# Patient Record
Sex: Female | Born: 1935 | Race: White | Hispanic: No | Smoking: Never smoker
Health system: Southern US, Community
[De-identification: ages and names within clinical notes are randomized; demographics above are authoritative.]

## PROBLEM LIST (undated history)

## (undated) DIAGNOSIS — F0390 Unspecified dementia without behavioral disturbance: Secondary | ICD-10-CM

## (undated) DIAGNOSIS — F0280 Dementia in other diseases classified elsewhere without behavioral disturbance: Secondary | ICD-10-CM

## (undated) DIAGNOSIS — G309 Alzheimer's disease, unspecified: Secondary | ICD-10-CM

---

## 2011-08-22 ENCOUNTER — Emergency Department (HOSPITAL_BASED_OUTPATIENT_CLINIC_OR_DEPARTMENT_OTHER)
Admission: EM | Admit: 2011-08-22 | Discharge: 2011-08-22 | Disposition: A | Discharge status: Home / Self Care | Payer: Medicare PPO | Attending: Emergency Medicine | Admitting: Emergency Medicine

## 2011-08-22 ENCOUNTER — Encounter (HOSPITAL_BASED_OUTPATIENT_CLINIC_OR_DEPARTMENT_OTHER): Payer: Self-pay | Admitting: Emergency Medicine

## 2011-08-22 DIAGNOSIS — L739 Follicular disorder, unspecified: Secondary | ICD-10-CM

## 2011-08-22 DIAGNOSIS — F0280 Dementia in other diseases classified elsewhere without behavioral disturbance: Secondary | ICD-10-CM | POA: Insufficient documentation

## 2011-08-22 DIAGNOSIS — L738 Other specified follicular disorders: Secondary | ICD-10-CM | POA: Insufficient documentation

## 2011-08-22 DIAGNOSIS — G309 Alzheimer's disease, unspecified: Secondary | ICD-10-CM | POA: Insufficient documentation

## 2011-08-22 HISTORY — DX: Alzheimer's disease, unspecified: G30.9

## 2011-08-22 HISTORY — DX: Dementia in other diseases classified elsewhere, unspecified severity, without behavioral disturbance, psychotic disturbance, mood disturbance, and anxiety: F02.80

## 2011-08-22 MED ORDER — SULFAMETHOXAZOLE-TRIMETHOPRIM 800-160 MG PO TABS: 1.0000 | ORAL_TABLET | Freq: Two times a day (BID) | ORAL | Status: AC

## 2011-08-22 MED ORDER — PREDNISONE 10 MG PO TABS: 20.0000 mg | ORAL_TABLET | Freq: Every day | ORAL | Status: DC

## 2011-08-22 MED ORDER — SULFAMETHOXAZOLE-TMP DS 800-160 MG PO TABS
1.0000 | ORAL_TABLET | Freq: Once | ORAL | Status: AC
  Administered 2011-08-22: 1 via ORAL
  Filled 2011-08-22: qty 1

## 2011-08-22 MED ORDER — PREDNISONE 20 MG PO TABS
40.0000 mg | ORAL_TABLET | Freq: Once | ORAL | Status: AC
  Administered 2011-08-22: 40 mg via ORAL
  Filled 2011-08-22: qty 2

## 2011-08-22 NOTE — ED Notes (Signed)
ptar notified of need for transport. Facility notified that patient is returning, ed transfer report provided for transfer back to facility along with discharge instructions

## 2011-08-22 NOTE — ED Provider Notes (Signed)
History     CSN: [id number]  Arrival date & time 08/22/11  0015   First MD Initiated Contact with Patient 08/22/11 0125      Chief Complaint  Patient presents with  . Rash    (Consider location/radiation/quality/duration/timing/severity/associated sxs/prior treatment) HPI Comments: Patient presents from an nursing home facility with complaints of a rash. Apparently, the rash started about 3 days ago on her abdomen and then spread to her face and some on her arms. She describes it as very Ht. There is no known new contacts, or new medications. She denies any history of allergic reactions in the past. Denies any fevers. Denies any nausea, vomiting. Denies any swelling of her face or lips. Denies any shortness of breath. She states that the rash has gradually been getting worse over the last few days  Patient is a 78 y.o. female presenting with rash. The history is provided by the patient and the nursing home.  Rash  This is a new problem. The current episode started more than 2 days ago. The problem has been gradually worsening. The problem is associated with nothing. There has been no fever.    Past Medical History  Diagnosis Date  . Alzheimer disease     History reviewed. No pertinent past surgical history.  History reviewed. No pertinent family history.  History  Substance Use Topics  . Smoking status: Not on file  . Smokeless tobacco: Not on file  . Alcohol Use: No    OB History    Grav Para Term Preterm Abortions TAB SAB Ect Mult Living                  Review of Systems  Constitutional: Negative for fever, chills, diaphoresis and fatigue.  HENT: Negative for congestion, rhinorrhea and sneezing.   Eyes: Negative.   Respiratory: Negative for cough, chest tightness and shortness of breath.   Cardiovascular: Negative for chest pain and leg swelling.  Gastrointestinal: Negative for nausea, vomiting, abdominal pain, diarrhea and blood in stool.  Genitourinary:  Negative for frequency, hematuria, flank pain and difficulty urinating.  Musculoskeletal: Negative for back pain and arthralgias.  Skin: Positive for rash.  Neurological: Negative for dizziness, speech difficulty, weakness, numbness and headaches.    Allergies  Review of patient's allergies indicates no known allergies.  Home Medications   Current Outpatient Rx  Name Route Sig Dispense Refill  . PREDNISONE 10 MG PO TABS Oral Take 2 tablets (20 mg total) by mouth daily. 10 tablet 0  . SULFAMETHOXAZOLE-TRIMETHOPRIM 800-160 MG PO TABS Oral Take 1 tablet by mouth every 12 (twelve) hours. 20 tablet 0    BP 156/51  Pulse 76  Temp(Src) 98.1 F (36.7 C) (Oral)  Resp 18  SpO2 99%  Physical Exam  Constitutional: She is oriented to person, place, and time. She appears well-developed and well-nourished.  HENT:  Head: Normocephalic and atraumatic.  Eyes: Pupils are equal, round, and reactive to light.  Neck: Normal range of motion. Neck supple.  Cardiovascular: Normal rate, regular rhythm and normal heart sounds.   Pulmonary/Chest: Effort normal and breath sounds normal. No respiratory distress. She has no wheezes. She has no rales. She exhibits no tenderness.  Abdominal: Soft. Bowel sounds are normal. There is no tenderness. There is no rebound and no guarding.  Musculoskeletal: Normal range of motion. She exhibits no edema.  Lymphadenopathy:    She has no cervical adenopathy.  Neurological: She is alert and oriented to person, place, and time.  Skin:  Skin is warm and dry. Rash noted.       Patient has multiple papules on the face and the chest and abdomen. They are slightly erythematous and are excoriated. There is no vesicular lesions. No signs of cellulitis. No drainage. No petechiae or purpura is noted. There is no rash on the hands or the feet.  Psychiatric: She has a normal mood and affect.    ED Course  Procedures (including critical care time)  Labs Reviewed - No data to  display No results found.   1. Folliculitis       MDM  This rash could represent contact dermatitis versus a folliculitis. Does not appear to be consistent with scabies. Will try course of antibiotics and steroids. Advised to follow up with her primary care physician to assess for improvement        Rolan Bucco, MD 08/22/11 [phone]

## 2011-08-22 NOTE — Discharge Instructions (Signed)
Folliculitis  °Folliculitis is an infection and inflammation of the hair follicles. Hair follicles become red and irritated. This inflammation is usually caused by bacteria. The bacteria thrive in warm, moist environments. This condition can be seen anywhere on the body.  °CAUSES °The most common cause of folliculitis is an infection by germs (bacteria). Fungal and viral infections can also cause the condition. Viral infections may be more common in people whose bodies are unable to fight disease well (weakened immune systems). Examples include people with: °· AIDS.  °· An organ transplant.  °· Cancer.  °People with depressed immune systems, diabetes, or obesity, have a greater risk of getting folliculitis than the general population. Certain chemicals, especially oils and tars, also can cause folliculitis. °SYMPTOMS °· An early sign of folliculitis is a small, white or yellow pus-filled, itchy lesion (pustule). These lesions appear on a red, inflamed follicle. They are usually less than 5 mm (.20 inches).  °· The most likely starting points are the scalp, thighs, legs, back and buttocks. Folliculitis is also frequently found in areas of repeated shaving.  °· When an infection of the follicle goes deeper, it becomes a boil or furuncle. A group of closely packed boils create a larger lesion (a carbuncle). These sores (lesions) tend to occur in hairy, sweaty areas of the body.  °TREATMENT  °· A doctor who specializes in skin problems (dermatologists) treats mild cases of folliculitis with antiseptic washes.  °· They also use a skin application which kills germs (topical antibiotics). Tea tree oil is a good topical antiseptic as well. It can be found at a health food store. A small percentage of individuals may develop an allergy to the tea tree oil.  °· Mild to moderate boils respond well to warm water compresses applied three times daily.  °· In some cases, oral antibiotics should be taken with the skin treatment.    °· If lesions contain large quantities of pus or fluid, your caregiver may drain them. This allows the topical antibiotics to get to the affected areas better.  °· Stubborn cases of folliculitis may respond to laser hair removal. This process uses a high intensity light beam (a laser) to destroy the follicle and reduces the scarring from folliculitis. After laser hair removal, hair will no longer grow in the laser treated area.  °Patients with long-lasting folliculitis need to find out where the infection is coming from. Germs can live in the nostrils of the patient. This can trigger an outbreak now and then. Sometimes the bacteria live in the nostrils of a family member. This person does not develop the disorder but they repeatedly re-expose others to the germ. To break the cycle of recurrence in the patient, the family member must also undergo treatment. °PREVENTION  °· Individuals who are predisposed to folliculitis should be extremely careful about personal hygiene.  °· Application of antiseptic washes may help prevent recurrences.  °· A topical antibiotic cream, mupirocin (Bactroban®), has been effective at reducing bacteria in the nostrils. It is applied inside the nose with your little finger. This is done twice daily for a week. Then it is repeated every 6 months.  °· Because follicle disorders tend to come back, patients must receive follow-up care. Your caregiver may be able to recognize a recurrence before it becomes severe.  °SEEK IMMEDIATE MEDICAL CARE IF:  °· You develop redness, swelling, or increasing pain in the area.  °· You have a fever.  °· You are not improving with treatment   or are getting worse.  °· You have any other questions or concerns.  °Document Released: 05/25/2001 Document Revised: 03/05/2011 Document Reviewed: 03/21/2008 °[employer]® Patient Information ©2012 [employer]. °

## 2011-08-22 NOTE — ED Notes (Signed)
Developed rash several days ago pt is resident from [facility], rash abdomen, chest some on back and upper extremeties

## 2011-09-07 ENCOUNTER — Emergency Department (HOSPITAL_BASED_OUTPATIENT_CLINIC_OR_DEPARTMENT_OTHER): Payer: Medicare PPO

## 2011-09-07 ENCOUNTER — Emergency Department (HOSPITAL_BASED_OUTPATIENT_CLINIC_OR_DEPARTMENT_OTHER)
Admission: EM | Admit: 2011-09-07 | Discharge: 2011-09-07 | Disposition: A | Discharge status: Home / Self Care | Payer: Medicare PPO | Attending: Emergency Medicine | Admitting: Emergency Medicine

## 2011-09-07 ENCOUNTER — Encounter (HOSPITAL_BASED_OUTPATIENT_CLINIC_OR_DEPARTMENT_OTHER): Payer: Self-pay | Admitting: Emergency Medicine

## 2011-09-07 DIAGNOSIS — F0280 Dementia in other diseases classified elsewhere without behavioral disturbance: Secondary | ICD-10-CM | POA: Insufficient documentation

## 2011-09-07 DIAGNOSIS — Z79899 Other long term (current) drug therapy: Secondary | ICD-10-CM | POA: Insufficient documentation

## 2011-09-07 DIAGNOSIS — S62609A Fracture of unspecified phalanx of unspecified finger, initial encounter for closed fracture: Secondary | ICD-10-CM

## 2011-09-07 DIAGNOSIS — Y9210 Unspecified residential institution as the place of occurrence of the external cause: Secondary | ICD-10-CM | POA: Insufficient documentation

## 2011-09-07 DIAGNOSIS — S8263XA Displaced fracture of lateral malleolus of unspecified fibula, initial encounter for closed fracture: Secondary | ICD-10-CM | POA: Insufficient documentation

## 2011-09-07 DIAGNOSIS — G309 Alzheimer's disease, unspecified: Secondary | ICD-10-CM | POA: Insufficient documentation

## 2011-09-07 DIAGNOSIS — M542 Cervicalgia: Secondary | ICD-10-CM | POA: Insufficient documentation

## 2011-09-07 DIAGNOSIS — S82839A Other fracture of upper and lower end of unspecified fibula, initial encounter for closed fracture: Secondary | ICD-10-CM

## 2011-09-07 MED ORDER — ACETAMINOPHEN-CODEINE #3 300-30 MG PO TABS: 1.0000 | ORAL_TABLET | Freq: Four times a day (QID) | ORAL | Status: AC | PRN

## 2011-09-07 MED ORDER — ACETAMINOPHEN 325 MG PO TABS
650.0000 mg | ORAL_TABLET | Freq: Once | ORAL | Status: AC
  Administered 2011-09-07: 650 mg via ORAL
  Filled 2011-09-07: qty 2
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
## 2011-09-07 NOTE — Discharge Instructions (Signed)
Ankle Fracture A fracture is a break in the bone. A cast or splint is used to protect and keep your injured bone from moving.  HOME CARE INSTRUCTIONS   Use your crutches as directed.   To lessen the swelling, keep the injured leg elevated while sitting or lying down.   Apply ice to the injury for 15 to 20 minutes, 3 to 4 times per day while awake for 2 days. Put the ice in a plastic bag and place a thin towel between the bag of ice and your cast.   If you have a plaster or fiberglass cast:   Do not try to scratch the skin under the cast using sharp or pointed objects.   Check the skin around the cast every day. You may put lotion on any red or sore areas.   Keep your cast dry and clean.   If you have a plaster splint:   Wear the splint as directed.   You may loosen the elastic around the splint if your toes become numb, tingle, or turn cold or blue.   Do not put pressure on any part of your cast or splint; it may break. Rest your cast only on a pillow the first 24 hours until it is fully hardened.   Your cast or splint can be protected during bathing with a plastic bag. Do not lower the cast or splint into water.   Take medications as directed by your caregiver. Only take over-the-counter or prescription medicines for pain, discomfort, or fever as directed by your caregiver.   Do not drive a vehicle until your caregiver specifically tells you it is safe to do so.   If your caregiver has given you a follow-up appointment, it is very important to keep that appointment. Not keeping the appointment could result in a chronic or permanent injury, pain, and disability. If there is any problem keeping the appointment, you must call back to this facility for assistance.  SEEK IMMEDIATE MEDICAL CARE IF:   Your cast gets damaged or breaks.   You have continued severe pain or more swelling than you did before the cast was put on.   Your skin or toenails below the injury turn blue or gray,  or feel cold or numb.   There is a bad smell or new stains and/or purulent (pus like) drainage coming from under the cast.  If you do not have a window in your cast for observing the wound, a discharge or minor bleeding may show up as a stain on the outside of your cast. Report these findings to your caregiver. MAKE SURE YOU:   Understand these instructions.   Will watch your condition.   Will get help right away if you are not doing well or get worse.  Document Released: 03/13/2000 Document Revised: 03/05/2011 Document Reviewed: 10/18/2007 [facility] Patient Information 2012 [place], [place].   Finger Fracture Fractures of fingers are breaks in the bones of the fingers. There are many types of fractures. There are different ways of treating these fractures, all of which can be correct. Your caregiver will discuss the best way to treat your fracture. TREATMENT  Finger fractures can be treated with:   Non-reduction - this means the bones are in place. The finger is splinted without changing the positions of the bone pieces. The splint is usually left on for about a week to ten days. This will depend on your fracture and what your caregiver thinks.   Closed reduction -  the bones are put back into position without using surgery. The finger is then splinted.   ORIF (open reduction and internal fixation) - the fracture site is opened. Then the bone pieces are fixed into place with pins or some type of hardware. This is seldom required. It depends on the severity of the fracture.  Your caregiver will discuss the type of fracture you have and the treatment that will be best for that problem. If surgery is the treatment of choice, the following is information for you to know and also let your caregiver know about prior to surgery. LET YOUR CAREGIVER KNOW ABOUT:  Allergies   Medications taken including herbs, eye drops, over the counter medications, and creams   Use of steroids (by mouth or  creams)   Previous problems with anesthetics or Novocaine   Possibility of pregnancy, if this applies   History of blood clots (thrombophlebitis)   History of bleeding or blood problems   Previous surgery   Other health problems  AFTER THE PROCEDURE After surgery, you will be taken to the recovery area where a nurse will check your progress. Once you're awake, stable, and taking fluids well, barring other problems you will be allowed to go home. Once home an ice pack applied to your operative site may help with discomfort and keep the swelling down. HOME CARE INSTRUCTIONS   Follow your caregiver's instructions as to activities, exercises, physical therapy, and driving a car.   Use your finger and exercise as directed.   Only take over-the-counter or prescription medicines for pain, discomfort, or fever as directed by your caregiver. Do not take aspirin until your caregiver OK's it, as this can increase bleeding immediately following surgery.   Stop using ibuprofen if it upsets your stomach. Let your caregiver know about it.  SEEK MEDICAL CARE IF:  You have increased bleeding (more than a small spot) from the wound or from beneath your splint.   You develop redness, swelling, or increasing pain in the wound or from beneath your splint.   There is pus coming from the wound or from beneath your splint.   An unexplained oral temperature above 102 F (38.9 C) develops, or as your caregiver suggests.   There is a foul smell coming from the wound or dressing or from beneath your splint.  SEEK IMMEDIATE MEDICAL CARE IF:   You develop a rash.   You have difficulty breathing.   You have any allergic problems.  MAKE SURE YOU:   Understand these instructions.   Will watch your condition.   Will get help right away if you are not doing well or get worse.  Document Released: 06/28/2000 Document Revised: 03/05/2011 Document Reviewed: 11/03/2007 [facility] Patient Information 2012  [place], [place].    Narcotic and benzodiazepine use may cause drowsiness, slowed breathing or dependence.  Please use with caution and do not drive, operate machinery or watch young children alone while taking them.  Taking combinations of these medications or drinking alcohol will potentiate these effects.
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
## 2012-03-14 ENCOUNTER — Emergency Department (HOSPITAL_COMMUNITY): Payer: Medicare PPO

## 2012-03-14 ENCOUNTER — Emergency Department (HOSPITAL_COMMUNITY)
Admission: EM | Admit: 2012-03-14 | Discharge: 2012-03-15 | Disposition: A | Discharge status: Skilled Nursing Facility | Payer: Medicare PPO | Attending: Emergency Medicine | Admitting: Emergency Medicine

## 2012-03-14 DIAGNOSIS — J029 Acute pharyngitis, unspecified: Secondary | ICD-10-CM | POA: Insufficient documentation

## 2012-03-14 DIAGNOSIS — R05 Cough: Secondary | ICD-10-CM | POA: Insufficient documentation

## 2012-03-14 DIAGNOSIS — F0280 Dementia in other diseases classified elsewhere without behavioral disturbance: Secondary | ICD-10-CM | POA: Insufficient documentation

## 2012-03-14 DIAGNOSIS — G309 Alzheimer's disease, unspecified: Secondary | ICD-10-CM | POA: Insufficient documentation

## 2012-03-14 DIAGNOSIS — R059 Cough, unspecified: Secondary | ICD-10-CM | POA: Insufficient documentation

## 2012-03-14 DIAGNOSIS — Z79899 Other long term (current) drug therapy: Secondary | ICD-10-CM | POA: Insufficient documentation

## 2012-03-14 DIAGNOSIS — Z791 Long term (current) use of non-steroidal anti-inflammatories (NSAID): Secondary | ICD-10-CM | POA: Insufficient documentation

## 2012-03-14 LAB — CBC
HCT: 35.2 % — ABNORMAL LOW (ref 36.0–46.0)
Hemoglobin: 11.4 g/dL — ABNORMAL LOW (ref 12.0–15.0)
MCH: 28.2 pg (ref 26.0–34.0)
MCHC: 32.4 g/dL (ref 30.0–36.0)
RDW: 14.9 % (ref 11.5–15.5)

## 2012-03-14 NOTE — ED Provider Notes (Addendum)
History     CSN: [id number]  Arrival date & time 03/14/12  2259   First MD Initiated Contact with Patient 03/14/12 2318      Chief Complaint  Patient presents with  . Allergic Reaction  . Cough    (Consider location/radiation/quality/duration/timing/severity/associated sxs/prior treatment) HPI History provided by patient. Complaining of throat discomfort since yesterday and worse tonight. She has some dizziness, some dry cough and feels like it is acting her breathing. No associated rash or itching. She denies dyspnea. Resides at an assisted-living facility. No fevers. States some difficulty swallowing, does not feel like food or fluids get stuck. No chest pain. Some generalized malaise. No abdominal pain or vomiting. Symptoms moderate in severity. Patient tells me she has history of same Past Medical History  Diagnosis Date  . Alzheimer disease     No past surgical history on file.  No family history on file.  History  Substance Use Topics  . Smoking status: Not on file  . Smokeless tobacco: Not on file  . Alcohol Use: No    OB History    Grav Para Term Preterm Abortions TAB SAB Ect Mult Living                  Review of Systems  Constitutional: Negative for fever and chills.  HENT: Positive for sore throat. Negative for neck pain, neck stiffness and voice change.   Eyes: Negative for pain.  Respiratory: Positive for cough. Negative for shortness of breath.   Cardiovascular: Negative for chest pain.  Gastrointestinal: Negative for vomiting and abdominal pain.  Genitourinary: Negative for dysuria.  Musculoskeletal: Negative for back pain.  Skin: Negative for rash.  Neurological: Negative for headaches.  All other systems reviewed and are negative.    Allergies  Review of patient's allergies indicates no known allergies.  Home Medications   Current Outpatient Rx  Name  Route  Sig  Dispense  Refill  . ACETAMINOPHEN 500 MG PO TABS   Oral   Take 500 mg by  mouth every 6 (six) hours as needed. For pain or fever greater than 100         . ALENDRONATE SODIUM 35 MG PO TABS   Oral   Take 35 mg by mouth every 7 (seven) days. On Fridays.Take with a full glass of water on an empty stomach.         . CEPHALEXIN 500 MG PO CAPS   Oral   Take 500 mg by mouth every 12 (twelve) hours. For 10 days to complete on 09/11/11         . DESONIDE 0.05 % EX CREA   Topical   Apply 1 application topically 2 (two) times daily. To rash on face for 10 days or until rash healed. Started on 09/02/11         . DONEPEZIL HCL 10 MG PO TABS   Oral   Take 20 mg by mouth at bedtime.         Marland Kitchen GABAPENTIN 600 MG PO TABS   Oral   Take 300 mg by mouth 2 (two) times daily.         Marland Kitchen MELATONIN 1 MG PO TABS   Oral   Take 2 tablets by mouth at bedtime. For insomnia         . SENNOSIDES 8.6 MG PO TABS   Oral   Take 1 tablet by mouth daily.         . SERTRALINE HCL 50  MG PO TABS   Oral   Take 50 mg by mouth every morning.           BP 153/69  Pulse 66  Temp 97.9 F (36.6 C) (Oral)  Resp 18  SpO2 98%  Physical Exam  Constitutional: She appears well-developed and well-nourished.  HENT:  Head: Normocephalic and atraumatic.  Mouth/Throat: Oropharynx is clear and moist. No oropharyngeal exudate.       Uvula midline, no posterior pharynx erythema or exudates  Eyes: Conjunctivae normal and EOM are normal. Pupils are equal, round, and reactive to light.  Neck: Trachea normal and normal range of motion. Neck supple. No tracheal deviation present. No thyromegaly present.  Cardiovascular: Normal rate, regular rhythm, S1 normal, S2 normal and normal pulses.     No systolic murmur is present   No diastolic murmur is present  Pulses:      Radial pulses are 2+ on the right side, and 2+ on the left side.  Pulmonary/Chest: Effort normal and breath sounds normal. No stridor. She has no wheezes. She has no rhonchi. She has no rales. She exhibits no tenderness.   Abdominal: Soft. Normal appearance and bowel sounds are normal. There is no tenderness. There is no CVA tenderness and negative Murphy's sign.  Musculoskeletal:       BLE:s Calves nontender, no cords or erythema, negative Homans sign  Lymphadenopathy:    She has no cervical adenopathy.  Neurological: She has normal strength. No cranial nerve deficit or sensory deficit. GCS eye subscore is 4. GCS verbal subscore is 5. GCS motor subscore is 6.       Awake alert and oriented, speech clear, no focal deficits  Skin: Skin is warm and dry. No rash noted. She is not diaphoretic.  Psychiatric: Her speech is normal.       Cooperative and appropriate    ED Course  Procedures (including critical care time)  Results for orders placed during the hospital encounter of 03/14/12  CBC      Component Value Range   WBC 8.1  4.0 - 10.5 K/uL   RBC 4.04  3.87 - 5.11 MIL/uL   Hemoglobin 11.4 (*) 12.0 - 15.0 g/dL   HCT 84.1 (*) 32.4 - 40.1 %   MCV 87.1  78.0 - 100.0 fL   MCH 28.2  26.0 - 34.0 pg   MCHC 32.4  30.0 - 36.0 g/dL   RDW 02.7  25.3 - 66.4 %   Platelets 185  150 - 400 K/uL  POCT I-STAT, CHEM 8      Component Value Range   Sodium 140  135 - 145 mEq/L   Potassium 3.9  3.5 - 5.1 mEq/L   Chloride 107  96 - 112 mEq/L   BUN 16  6 - 23 mg/dL   Creatinine, Ser 4.03  0.50 - 1.10 mg/dL   Glucose, Bld 93  70 - 99 mg/dL   Calcium, Ion 4.74  2.59 - 1.30 mmol/L   TCO2 25  0 - 100 mmol/L   Hemoglobin 11.9 (*) 12.0 - 15.0 g/dL   HCT 56.3 (*) 87.5 - 64.3 %  POCT I-STAT TROPONIN I      Component Value Range   Troponin i, poc 0.00  0.00 - 0.08 ng/mL   Comment 3           POCT I-STAT TROPONIN I      Component Value Range   Troponin i, poc 0.00  0.00 - 0.08 ng/mL  Comment 3            Dg Neck Soft Tissue  03/15/2012  *RADIOLOGY REPORT*  Clinical Data: Sore throat, cough  NECK SOFT TISSUES - 1+ VIEW  Comparison: None.  Findings: Unremarkable lateral neck soft tissues.  Normal epiglottis.  No  prevertebral soft tissue swelling/retropharyngeal abscess.  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  IMPRESSION: Unremarkable lateral neck soft tissues.   Original Report Authenticated By: Charline Bills, M.D.    Dg Chest 2 View  03/14/2012  *RADIOLOGY REPORT*  Clinical Data: Cough  CHEST - 2 VIEW  Comparison: None.  Findings: Lungs are essentially clear.  No focal consolidation. No pleural effusion or pneumothorax.  Cardiomediastinal silhouette is within normal limits.  Mild degenerative changes of the visualized thoracolumbar spine.  IMPRESSION: No evidence of acute cardiopulmonary disease.   Original Report Authenticated By: Charline Bills, M.D.      Date: 03/14/2012  Rate: 63  Rhythm: normal sinus rhythm  QRS Axis: normal  Intervals: normal  ST/T Wave abnormalities: nonspecific ST changes  Conduction Disutrbances:none  Narrative Interpretation:   Old EKG Reviewed: none available  12:43 AM patient does have Alzheimer's, spoke with nursing facility and this is a new complaint for patient, no new meds, has been able to feed herself and take medications without issues. Attempted to discuss with family - no answer at telephone numbers provided.  Patient given soluMedrol and Benadryl on recheck has persistent complaints. Medicine consult obtained. Dr. Joneen Roach, [facility] hospitalist, evaluated bedside and patient is now asymptomatic. Repeat troponin obtained and negative as above. Dr. Joneen Roach recommends safe for discharge home  MDM   Throat pain with dyspnea. Evaluated with EKG, serial cardiac enzymes, and imaging as above. Given site Medrol Benadryl for possible allergic reaction. Medicine consult. Symptomatically improved. Patient discharged back to nursing facility.   No fevers.  Tolerates PO fluids without distress.     Sunnie Nielsen, MD 03/15/12 1610  Sunnie Nielsen, MD 03/15/12 [phone]

## 2012-03-14 NOTE — ED Notes (Signed)
Brought in from AL facility by EMS. Pt c/o general malaise, sore throat, cough today, dizzy and weakness. "feels like something is in here" as she point to her chest. Tightness and Pressure is on my chest, I cant breathe good.

## 2012-03-14 NOTE — ED Notes (Signed)
Per EMS: thought to have had an allergic rxn, cough x 1 day. Skin warm and dry, no uticaria, no change in ADL's per staff. Abnl in her throat x 1 day. Denies N/V T: 98.8 (T)  BP: 160/63  P:70  RR: 20 98 % RA   CBG-91

## 2012-03-14 NOTE — ED Notes (Signed)
[id number]<WG> Expected date:<BR> Expected time:<BR> Means of arrival:<BR> Comments:<BR> 78 yo female from assisted living-thought she was having an allergic reaction-states &quot;felt different&quot;-per EMS/no rash/hives or itching-no SOB

## 2012-03-15 ENCOUNTER — Encounter (HOSPITAL_COMMUNITY): Payer: Self-pay

## 2012-03-15 LAB — POCT I-STAT, CHEM 8
Creatinine, Ser: 0.9 mg/dL (ref 0.50–1.10)
Glucose, Bld: 93 mg/dL (ref 70–99)
HCT: 35 % — ABNORMAL LOW (ref 36.0–46.0)
Hemoglobin: 11.9 g/dL — ABNORMAL LOW (ref 12.0–15.0)
Potassium: 3.9 mEq/L (ref 3.5–5.1)
TCO2: 25 mmol/L (ref 0–100)

## 2012-03-15 LAB — POCT I-STAT TROPONIN I: Troponin i, poc: 0 ng/mL (ref 0.00–0.08)

## 2012-03-15 MED ORDER — PREDNISONE 20 MG PO TABS: 60.0000 mg | ORAL_TABLET | Freq: Every day | ORAL | Status: DC

## 2012-03-15 MED ORDER — FAMOTIDINE IN NACL 20-0.9 MG/50ML-% IV SOLN
20.0000 mg | Freq: Once | INTRAVENOUS | Status: AC
Start: 2012-03-15 — End: 2012-03-15
  Administered 2012-03-15: 20 mg via INTRAVENOUS
  Filled 2012-03-15: qty 50

## 2012-03-15 MED ORDER — METHYLPREDNISOLONE SODIUM SUCC 125 MG IJ SOLR
125.0000 mg | Freq: Once | INTRAMUSCULAR | Status: AC
  Administered 2012-03-15: 125 mg via INTRAVENOUS
  Filled 2012-03-15: qty 2

## 2012-03-15 MED ORDER — DIPHENHYDRAMINE HCL 50 MG/ML IJ SOLN
25.0000 mg | Freq: Once | INTRAMUSCULAR | Status: AC
  Administered 2012-03-15: 25 mg via INTRAVENOUS
  Filled 2012-03-15: qty 1

## 2012-03-15 NOTE — ED Notes (Signed)
Hospitalist at bedisde.

## 2012-03-15 NOTE — ED Notes (Signed)
Spk  with both Monica, Med tech that gives medications at AL. She states pt has  never c/o difficulty with swallowing. States she didn't mention any problems when taking  Medications this evening. States patient is just forgetful  Marchelle Folks, nurse at [facility] facility states she is independent of eating. No complication with swallowing. This is why she felt she needed to be sent out d/t new onset of sx's

## 2012-03-15 NOTE — ED Notes (Addendum)
Pt was offered water. Stayed in her room while water was administered. Pt states she had not problems with swallowing water. She just states it feels like her throat is closing. No cough or gurgling heard by this nurse.

## 2012-03-15 NOTE — ED Notes (Signed)
Unable to call report to [facility].

## 2012-03-15 NOTE — ED Notes (Signed)
Patient is resting comfortably. 

## 2012-06-09 ENCOUNTER — Emergency Department (HOSPITAL_COMMUNITY)
Admission: EM | Admit: 2012-06-09 | Discharge: 2012-06-09 | Disposition: A | Discharge status: Home / Self Care | Payer: Medicare PPO | Attending: Emergency Medicine | Admitting: Emergency Medicine

## 2012-06-09 ENCOUNTER — Encounter (HOSPITAL_COMMUNITY): Payer: Self-pay | Admitting: *Deleted

## 2012-06-09 DIAGNOSIS — F0280 Dementia in other diseases classified elsewhere without behavioral disturbance: Secondary | ICD-10-CM | POA: Insufficient documentation

## 2012-06-09 DIAGNOSIS — Z79899 Other long term (current) drug therapy: Secondary | ICD-10-CM | POA: Insufficient documentation

## 2012-06-09 DIAGNOSIS — B3731 Acute candidiasis of vulva and vagina: Secondary | ICD-10-CM | POA: Insufficient documentation

## 2012-06-09 DIAGNOSIS — N764 Abscess of vulva: Secondary | ICD-10-CM | POA: Insufficient documentation

## 2012-06-09 DIAGNOSIS — G309 Alzheimer's disease, unspecified: Secondary | ICD-10-CM | POA: Insufficient documentation

## 2012-06-09 MED ORDER — LIDOCAINE HCL (PF) 1 % IJ SOLN
5.0000 mL | Freq: Once | INTRAMUSCULAR | Status: DC
  Filled 2012-06-09: qty 5

## 2012-06-09 MED ORDER — FLUCONAZOLE 100 MG PO TABS
150.0000 mg | ORAL_TABLET | Freq: Once | ORAL | Status: AC
  Administered 2012-06-09: 150 mg via ORAL
  Filled 2012-06-09: qty 2

## 2012-06-09 MED ORDER — CEPHALEXIN 500 MG PO CAPS: 500.0000 mg | ORAL_CAPSULE | Freq: Four times a day (QID) | ORAL | Status: DC

## 2012-06-09 NOTE — ED Notes (Signed)
Pt has rash and swollen areas on bilateral labial region of vagina. States that her vagina has been swollen, red and painful for past month and that she just told the facility about it a couple of days ago.

## 2012-06-09 NOTE — ED Notes (Signed)
Called [facility] to pick patient up to return her to [facility]

## 2012-06-09 NOTE — ED Notes (Signed)
Pt from [facility] and was recently diagnosed and treated for a yeast infection. Facility noticed perineal blisters with pus and called EMS to bring pt to Er for further evaluation. VSS. 132/70, HR 60, RR 18. Alert and oriented x 4. Neuro intact.

## 2012-06-09 NOTE — ED Provider Notes (Signed)
History     CSN: [id number]  Arrival date & time 06/09/12  1525   First MD Initiated Contact with Patient 06/09/12 1529      Chief Complaint  Patient presents with  . Rash    (Consider location/radiation/quality/duration/timing/severity/associated sxs/prior treatment) Patient is a 79 y.o. female presenting with rash and abscess.  Rash Associated symptoms: no abdominal pain, no diarrhea, no fever, no nausea, no shortness of breath and not vomiting   Abscess Location:  Ano-genital Ano-genital abscess location:  Vagina Size:  1x1cm Abscess quality: draining, fluctuance, painful and redness   Red streaking: yes   Progression:  Worsening Pain details:    Quality:  Unable to specify   Severity:  Moderate Context comment:  Felt to have a yeast infection several days ago Relieved by:  Nothing Worsened by:  Draining/squeezing Associated symptoms: no fever, no nausea and no vomiting     Past Medical History  Diagnosis Date  . Alzheimer disease     No past surgical history on file.  No family history on file.  History  Substance Use Topics  . Smoking status: Not on file  . Smokeless tobacco: Not on file  . Alcohol Use: No    OB History   Grav Para Term Preterm Abortions TAB SAB Ect Mult Living                  Review of Systems  Constitutional: Negative for fever.  HENT: Negative for congestion.   Respiratory: Negative for cough and shortness of breath.   Cardiovascular: Negative for chest pain.  Gastrointestinal: Negative for nausea, vomiting, abdominal pain and diarrhea.  Genitourinary: Negative for difficulty urinating.  Skin: Positive for rash.  All other systems reviewed and are negative.    Allergies  Review of patient's allergies indicates no known allergies.  Home Medications   Current Outpatient Rx  Name  Route  Sig  Dispense  Refill  . acetaminophen (TYLENOL) 500 MG tablet   Oral   Take 500 mg by mouth 2 (two) times daily. May take  additional tablet every 6 hours as needed for pain or fever >100         . alendronate (FOSAMAX) 35 MG tablet   Oral   Take 35 mg by mouth every 7 (seven) days. On Fridays.Take with a full glass of water on an empty stomach.         . donepezil (ARICEPT) 10 MG tablet   Oral   Take 20 mg by mouth at bedtime.         . gabapentin (NEURONTIN) 600 MG tablet   Oral   Take 300 mg by mouth 2 (two) times daily.         . Melatonin 1 MG TABS   Oral   Take 2 tablets by mouth at bedtime. For insomnia         . predniSONE (DELTASONE) 20 MG tablet   Oral   Take 3 tablets (60 mg total) by mouth daily.   15 tablet   0   . senna (SENOKOT) 8.6 MG tablet   Oral   Take 1 tablet by mouth every morning.          . sertraline (ZOLOFT) 50 MG tablet   Oral   Take 50 mg by mouth every morning.           There were no vitals taken for this visit.  Physical Exam  Nursing note and vitals reviewed. Constitutional: She is  oriented to person, place, and time. She appears well-developed and well-nourished. No distress.  HENT:  Head: Normocephalic and atraumatic.  Mouth/Throat: Oropharynx is clear and moist.  Eyes: Conjunctivae are normal. Pupils are equal, round, and reactive to light. No scleral icterus.  Neck: Neck supple.  Cardiovascular: Normal rate, regular rhythm, normal heart sounds and intact distal pulses.   No murmur heard. Pulmonary/Chest: Effort normal and breath sounds normal. No stridor. No respiratory distress. She has no rales.  Abdominal: Soft. Bowel sounds are normal. She exhibits no distension. There is no tenderness.  Genitourinary:  1 x 1 cm abscess to right labia majora.  Bilateral labia are beefy red.    Musculoskeletal: Normal range of motion.  Neurological: She is alert and oriented to person, place, and time.  Skin: Skin is warm and dry. No rash noted.  Psychiatric: She has a normal mood and affect. Her behavior is normal.    ED Course  INCISION AND  DRAINAGE Date/Time: 06/09/2012 4:44 PM Performed by: Rennis Petty Authorized by: Rennis Petty Consent: Verbal consent obtained. Risks and benefits: risks, benefits and alternatives were discussed Type: abscess Body area: anogenital Location details: vulva Local anesthetic: lidocaine 1% without epinephrine Scalpel size: 11 Incision type: single straight Complexity: simple Drainage: purulent Drainage amount: moderate Wound treatment: wound left open Packing material: 1/4 in iodoform gauze Patient tolerance: Patient tolerated the procedure well with no immediate complications.   (including critical care time)  Labs Reviewed - No data to display No results found.   1. Abscess of labia majora   2. Candidiasis of vulva       MDM  79 yo female with abscess to right labia majora in setting of candidal yeast infection.  Drained without difficulty.  Given fluconazole and keflex.  Well appearing, non toxic, no systemic symptoms.  Given return precautions and DC'd back to SNF.          Rennis Petty, MD 06/09/12 [phone]

## 2012-06-10 NOTE — ED Provider Notes (Signed)
I saw and evaluated the patient, reviewed the resident's note and I agree with the findings and plan. Pt with groin rash/fungal rash, perineal abscess. abd soft nt. No fever. I and D.   Suzi Roots, MD 06/10/12 [phone]

## 2012-06-11 ENCOUNTER — Encounter (HOSPITAL_COMMUNITY): Payer: Self-pay | Admitting: Nurse Practitioner

## 2012-06-11 ENCOUNTER — Emergency Department (HOSPITAL_COMMUNITY)
Admission: EM | Admit: 2012-06-11 | Discharge: 2012-06-11 | Disposition: A | Discharge status: Home / Self Care | Payer: Medicare PPO | Attending: Emergency Medicine | Admitting: Emergency Medicine

## 2012-06-11 DIAGNOSIS — Z79899 Other long term (current) drug therapy: Secondary | ICD-10-CM | POA: Insufficient documentation

## 2012-06-11 DIAGNOSIS — B3789 Other sites of candidiasis: Secondary | ICD-10-CM | POA: Insufficient documentation

## 2012-06-11 DIAGNOSIS — F0280 Dementia in other diseases classified elsewhere without behavioral disturbance: Secondary | ICD-10-CM | POA: Insufficient documentation

## 2012-06-11 DIAGNOSIS — N751 Abscess of Bartholin's gland: Secondary | ICD-10-CM | POA: Insufficient documentation

## 2012-06-11 DIAGNOSIS — G309 Alzheimer's disease, unspecified: Secondary | ICD-10-CM | POA: Insufficient documentation

## 2012-06-11 NOTE — ED Provider Notes (Signed)
  Medical screening examination/treatment/procedure(s) were conducted as a shared visit with non-physician practitioner(s) and myself.  I personally evaluated the patient during the encounter  I personally evaluated this patient with chaperone present in the exam room, has bilateral labial abscesses, mild tenderness, left labial abscess spontaneously draining through a very small area. Fluctuance on the left, induration on the right. No fever, otherwise the patient has a benign appearance with a soft abdomen, clear heart and lung sounds.  Incision and drainage performed by physician assistant  Vida Roller, MD 06/11/12 2358

## 2012-06-11 NOTE — ED Notes (Signed)
Report given to [facility], pt to f/u for wound check in 2 days.  Not to remove Word catheters for Bartholin's cyst.  Pt voiced understanding not to remove drains.

## 2012-06-11 NOTE — ED Notes (Signed)
Pt called ems because she was told to return here for wound recheck today. Pt with abscess to labia majora that she was treated here for on 3/13 and reports no complaints and wound appears to be healing well

## 2012-06-11 NOTE — ED Provider Notes (Signed)
History     CSN: [id number]  Arrival date & time 06/11/12  1354   First MD Initiated Contact with Patient 06/11/12 1529      Chief Complaint  Patient presents with  . Wound Infection    (Consider location/radiation/quality/duration/timing/severity/associated sxs/prior treatment) The history is provided by the patient and medical records. The history is limited by the absence of a caregiver (pt with Alzheimers). No language interpreter was used.   Renee Kramer is a 79 y.o. female  with a hx of Alzheimers presents to the Emergency Department complaining of gradual, persistent, progressively worsening pain in the labia onset before 06/09/2012. Associated symptoms include pain in her labia majora, drainage, itching.  Nothing makes it better and palpation makes it worse.  Pt denies fever, chills, chest pain, shortness of breath, nausea, vomiting, diarrhea, weakness, dizziness, syncope, dysuria.  Patient is a poor historian.   Patient is unsure what medication she was given and thinks she has been taking it.  Record review: Patient was seen on 313 for abscess to the labia majora with I&D at that time. Patient also found to have candidal infection of the labia and was treated with Keflex and Diflucan.   Past Medical History  Diagnosis Date  . Alzheimer disease     History reviewed. No pertinent past surgical history.  History reviewed. No pertinent family history.  History  Substance Use Topics  . Smoking status: Not on file  . Smokeless tobacco: Not on file  . Alcohol Use: No    OB History   Grav Para Term Preterm Abortions TAB SAB Ect Mult Living                  Review of Systems  Unable to perform ROS: Dementia  Constitutional: Negative for fever, chills and fatigue.  Respiratory: Negative for shortness of breath.   Cardiovascular: Negative for chest pain.  Gastrointestinal: Negative for nausea, vomiting and abdominal pain.  Endocrine: Negative for polyuria.   Genitourinary: Negative for dysuria, hematuria, flank pain, vaginal bleeding and vaginal discharge.  Skin: Positive for rash and wound. Negative for color change.  Allergic/Immunologic: Negative for immunocompromised state.  Neurological: Negative for syncope, light-headedness and headaches.  Hematological: Negative for adenopathy.  Psychiatric/Behavioral: The patient is not nervous/anxious.     Allergies  Review of patient's allergies indicates no known allergies.  Home Medications   Current Outpatient Rx  Name  Route  Sig  Dispense  Refill  . acetaminophen (TYLENOL) 325 MG tablet   Oral   Take 650 mg by mouth 2 (two) times daily.         Marland Kitchen alendronate (FOSAMAX) 35 MG tablet   Oral   Take 35 mg by mouth every 7 (seven) days. On Fridays         . cephALEXin (KEFLEX) 500 MG capsule   Oral   Take 500 mg by mouth 4 (four) times daily. For 7 days; Start date 06/09/12         . donepezil (ARICEPT) 10 MG tablet   Oral   Take 20 mg by mouth at bedtime.         . fluconazole (DIFLUCAN) 150 MG tablet   Oral   Take 150 mg by mouth daily. For 3 days; Start date 06/07/12         . gabapentin (NEURONTIN) 600 MG tablet   Oral   Take 300 mg by mouth 2 (two) times daily.         Marland Kitchen  Melatonin 1 MG TABS   Oral   Take 2 tablets by mouth at bedtime.          . senna (SENOKOT) 8.6 MG tablet   Oral   Take 1 tablet by mouth every morning.          . sertraline (ZOLOFT) 50 MG tablet   Oral   Take 50 mg by mouth every morning.           BP 150/82  Pulse 87  Temp(Src) 98.2 F (36.8 C) (Oral)  Resp 16  SpO2 97%  Physical Exam  Nursing note and vitals reviewed. Constitutional: She is oriented to person, place, and time. She appears well-developed and well-nourished. No distress.  HENT:  Head: Normocephalic and atraumatic.  Mouth/Throat: Oropharynx is clear and moist.  Eyes: Conjunctivae are normal. Pupils are equal, round, and reactive to light. No scleral  icterus.  Neck: Normal range of motion.  Cardiovascular: Normal rate, regular rhythm, normal heart sounds and intact distal pulses.  Exam reveals no gallop and no friction rub.   No murmur heard. Pulmonary/Chest: Effort normal and breath sounds normal. No respiratory distress. She has no wheezes.  Abdominal: Soft. She exhibits no distension. There is no tenderness. There is no rebound and no guarding.  Genitourinary: There is rash, tenderness and lesion on the right labia. There is rash, tenderness and lesion on the left labia.  Bilateral bartholan gland abscess with induration, tenderness and drainage of the L.   Pt also with generalized beefy red erythema covering the labia majora and groin area   Musculoskeletal: She exhibits no edema and no tenderness.  Lymphadenopathy:    She has no cervical adenopathy.  Neurological: She is alert and oriented to person, place, and time. She exhibits normal muscle tone. Coordination normal.  Skin: Skin is warm and dry. She is not diaphoretic. There is erythema.  Psychiatric: She has a normal mood and affect.    ED Course  INCISION AND DRAINAGE Date/Time: 06/11/2012 3:51 PM Performed by: Dierdre Forth Authorized by: Dierdre Forth Consent: Verbal consent obtained. Risks and benefits: risks, benefits and alternatives were discussed Consent given by: patient Patient understanding: patient states understanding of the procedure being performed Patient consent: the patient's understanding of the procedure matches consent given Procedure consent: procedure consent matches procedure scheduled Relevant documents: relevant documents present and verified Site marked: the operative site was marked Required items: required blood products, implants, devices, and special equipment available Patient identity confirmed: verbally with patient and arm band Time out: Immediately prior to procedure a "time out" was called to verify the correct patient,  procedure, equipment, support staff and site/side marked as required. Type: abscess Body area: anogenital Location details: Bartholin's gland Anesthesia: local infiltration Local anesthetic: lidocaine 2% without epinephrine Anesthetic total: 5 ml Patient sedated: no Scalpel size: 11 Incision type: single straight Complexity: complex Drainage: purulent Drainage amount: moderate Wound treatment: drain placed Patient tolerance: Patient tolerated the procedure well with no immediate complications.  INCISION AND DRAINAGE Date/Time: 06/11/2012 3:52 PM Performed by: Dierdre Forth Authorized by: Dierdre Forth Consent: Verbal consent obtained. Risks and benefits: risks, benefits and alternatives were discussed Consent given by: patient Patient understanding: patient states understanding of the procedure being performed Patient consent: the patient's understanding of the procedure matches consent given Procedure consent: procedure consent matches procedure scheduled Relevant documents: relevant documents present and verified Site marked: the operative site was marked Required items: required blood products, implants, devices, and special equipment available Patient identity  confirmed: verbally with patient Time out: Immediately prior to procedure a "time out" was called to verify the correct patient, procedure, equipment, support staff and site/side marked as required. Type: abscess Body area: anogenital Location details: Bartholin's gland Anesthesia: local infiltration Local anesthetic: lidocaine 2% without epinephrine Anesthetic total: 5 ml Patient sedated: no Scalpel size: 11 Incision type: single straight Complexity: complex Drainage: purulent Drainage amount: scant Wound treatment: drain placed Patient tolerance: Patient tolerated the procedure well with no immediate complications.   (including critical care time)  Labs Reviewed - No data to display No results  found.   1. Bartholin's gland abscess   2. Candida albicans infection       MDM  Raechel Chute presents with bartholin gland abscess, bilateral.  Pt is a diabetic which is likely to contribute to abscess formation.  Pt also wearing depends which has contributed to her candida infection and potentially to the abscess.  Patient with skin abscess amenable to incision and drainage.  Abscess was was large enough to warrant word drain.  Pt is advised to return in 2 days for wound recheck and drain removal. Encouraged home warm soaks and hot compresses.  Mild signs of cellulitis is surrounding skin.  Will d/c to SNF.  Pt is to continue to take her keflex and diflucan as prescribed. I reviewed available ER/hospitalization records through the EMR.     1. Medications: continue to take the keflex and diflucan as prescribed until they are finished, usual home medications 2. Treatment: rest, drink plenty of fluids, do not pull on drains 3. Follow Up: Please followup with your primary doctor for discussion of your diagnoses and further evaluation after today's visit; F/U in the ER in 2 days for drain removal.           Dierdre Forth, PA-C 06/11/12 1703  Dahlia Client Gracie Gupta, PA-C 06/11/12 1704

## 2012-06-11 NOTE — ED Notes (Signed)
Dr. Hyacinth Meeker at bedside to assess

## 2014-10-04 ENCOUNTER — Encounter (HOSPITAL_COMMUNITY): Payer: Self-pay | Admitting: *Deleted

## 2014-10-04 ENCOUNTER — Emergency Department (HOSPITAL_COMMUNITY): Payer: Medicare FFS

## 2014-10-04 ENCOUNTER — Emergency Department (HOSPITAL_COMMUNITY)
Admission: EM | Admit: 2014-10-04 | Discharge: 2014-10-05 | Disposition: A | Discharge status: Home / Self Care | Payer: Medicare FFS | Attending: Emergency Medicine | Admitting: Emergency Medicine

## 2014-10-04 DIAGNOSIS — F0280 Dementia in other diseases classified elsewhere without behavioral disturbance: Secondary | ICD-10-CM | POA: Diagnosis not present

## 2014-10-04 DIAGNOSIS — Y9389 Activity, other specified: Secondary | ICD-10-CM | POA: Diagnosis not present

## 2014-10-04 DIAGNOSIS — J189 Pneumonia, unspecified organism: Secondary | ICD-10-CM | POA: Diagnosis not present

## 2014-10-04 DIAGNOSIS — Z79899 Other long term (current) drug therapy: Secondary | ICD-10-CM | POA: Insufficient documentation

## 2014-10-04 DIAGNOSIS — G309 Alzheimer's disease, unspecified: Secondary | ICD-10-CM | POA: Diagnosis not present

## 2014-10-04 DIAGNOSIS — S29001A Unspecified injury of muscle and tendon of front wall of thorax, initial encounter: Secondary | ICD-10-CM | POA: Diagnosis not present

## 2014-10-04 DIAGNOSIS — W01198A Fall on same level from slipping, tripping and stumbling with subsequent striking against other object, initial encounter: Secondary | ICD-10-CM | POA: Insufficient documentation

## 2014-10-04 DIAGNOSIS — Y998 Other external cause status: Secondary | ICD-10-CM | POA: Insufficient documentation

## 2014-10-04 DIAGNOSIS — Y9289 Other specified places as the place of occurrence of the external cause: Secondary | ICD-10-CM | POA: Diagnosis not present

## 2014-10-04 DIAGNOSIS — J90 Pleural effusion, not elsewhere classified: Secondary | ICD-10-CM | POA: Diagnosis not present

## 2014-10-04 HISTORY — DX: Unspecified dementia, unspecified severity, without behavioral disturbance, psychotic disturbance, mood disturbance, and anxiety: F03.90

## 2014-10-04 MED ORDER — ACETAMINOPHEN 500 MG PO TABS
1000.0000 mg | ORAL_TABLET | Freq: Once | ORAL | Status: AC
  Administered 2014-10-04: 1000 mg via ORAL
  Filled 2014-10-04: qty 2

## 2014-10-04 NOTE — ED Notes (Signed)
Pt arrives to the ER via EMS for complaints of unwitnessed fall; pt fell outside and c/o rt right rib pain; rt rib tender to palpation

## 2014-10-04 NOTE — ED Notes (Signed)
Bed: [id number] Expected date:  Expected time:  Means of arrival:  Comments: EMS/89F/R abd pain

## 2014-10-04 NOTE — ED Provider Notes (Addendum)
CSN: [id number]     Arrival date & time 10/04/14  2005 History   First MD Initiated Contact with Patient 10/04/14 2009     Chief Complaint  Patient presents with  . Fall     (Consider location/radiation/quality/duration/timing/severity/associated sxs/prior Treatment) HPI The patient ports that she was outside with other people. She reports that she slipped on a wet area and caused her feet to go out. She states that she twisted really hard and got a pain in the right side of her chest. She states that she fell on her buttock's but did not she fall onto her chest or hit her head. The only area she reports it hurts however is the right anterior lateral chest wall area. He denies other associated symptoms. Past Medical History  Diagnosis Date  . Alzheimer disease   . Dementia    History reviewed. No pertinent past surgical history. No family history on file. History  Substance Use Topics  . Smoking status: Never Smoker   . Smokeless tobacco: Not on file  . Alcohol Use: No   OB History    No data available     Review of Systems  10 Systems reviewed and are negative for acute change except as noted in the HPI.   Allergies  Review of patient's allergies indicates no known allergies.  Home Medications   Prior to Admission medications   Medication Sig Start Date End Date Taking? Authorizing Provider  acetaminophen (TYLENOL) 325 MG tablet Take 325 mg by mouth 2 (two) times daily.    Yes Historical Provider, MD  alendronate (FOSAMAX) 35 MG tablet Take 35 mg by mouth every 7 (seven) days. On Fridays   Yes Historical Provider, MD  cetirizine (ZYRTEC) 10 MG tablet Take 10 mg by mouth at bedtime.   Yes Historical Provider, MD  cholecalciferol (VITAMIN D) 1000 UNITS tablet Take 1,000 Units by mouth daily.   Yes Historical Provider, MD  clobetasol ointment (TEMOVATE) 0.05 % Apply 1 application topically 2 (two) times daily as needed.   Yes Historical Provider, MD  Cranberry 475 MG CAPS  Take 1 capsule by mouth 2 (two) times daily.   Yes Historical Provider, MD  donepezil (ARICEPT) 5 MG tablet Take 5 mg by mouth at bedtime.   Yes Historical Provider, MD  ferrous sulfate 325 (65 FE) MG tablet Take 325 mg by mouth at bedtime.   Yes Historical Provider, MD  gabapentin (NEURONTIN) 100 MG capsule Take 200 mg by mouth 2 (two) times daily.   Yes Historical Provider, MD  guaifenesin (ROBITUSSIN) 100 MG/5ML syrup Take 200 mg by mouth every 6 (six) hours as needed for cough.   Yes Historical Provider, MD  lisinopril (PRINIVIL,ZESTRIL) 5 MG tablet Take 5 mg by mouth daily.   Yes Historical Provider, MD  loperamide (IMODIUM) 2 MG capsule Take 2 mg by mouth as needed for diarrhea or loose stools.   Yes Historical Provider, MD  Melatonin 1 MG TABS Take 2 tablets by mouth at bedtime.   Yes Historical Provider, MD  methimazole (TAPAZOLE) 10 MG tablet Take 10 mg by mouth 3 (three) times daily.   Yes Historical Provider, MD  nystatin cream (MYCOSTATIN) Apply 1 application topically 2 (two) times daily as needed for dry skin.   Yes Historical Provider, MD  senna (SENOKOT) 8.6 MG tablet Take 2 tablets by mouth every morning.    Yes Historical Provider, MD  sertraline (ZOLOFT) 50 MG tablet Take 50 mg by mouth every morning.  Yes Historical Provider, MD   BP 102/53 mmHg  Pulse 87  Temp(Src) 98.2 F (36.8 C) (Oral)  Resp 18  SpO2 96% Physical Exam  Constitutional: She is oriented to person, place, and time. She appears well-developed and well-nourished.  HENT:  Head: Normocephalic and atraumatic.  Eyes: EOM are normal. Pupils are equal, round, and reactive to light.  Neck: Neck supple.  Cardiovascular: Normal rate, regular rhythm, normal heart sounds and intact distal pulses.   Pulmonary/Chest: Effort normal and breath sounds normal.  Abdominal: Soft. Bowel sounds are normal. She exhibits no distension. There is no tenderness.  Musculoskeletal: Normal range of motion. She exhibits no edema.   Neurological: She is alert and oriented to person, place, and time. She has normal strength. Coordination normal. GCS eye subscore is 4. GCS verbal subscore is 5. GCS motor subscore is 6.  Skin: Skin is warm, dry and intact.  Psychiatric: She has a normal mood and affect.    ED Course  Procedures (including critical care time) Labs Review Labs Reviewed - No data to display  Imaging Review Dg Chest 2 View  10/04/2014   CLINICAL DATA:  Pain following fall.  Right-sided chest pain  EXAM: CHEST  2 VIEW  COMPARISON:  March 14, 2012  FINDINGS: There is a small right pleural effusion. There is no demonstrable pneumothorax. There is stable mild scarring in the left base. There is no edema or consolidation. The heart size and pulmonary vascularity are normal. No adenopathy. No fractures are appreciable on this study.  IMPRESSION: Small right pleural effusion. No edema or consolidation. No pneumothorax. Stable scarring left base. Given the right effusion in the history of right-sided trauma, correlation with rib detail views may be helpful to further assess.   Electronically Signed   By: Bretta BangWilliam  Woodruff III M.D.   On: 10/04/2014 21:25     EKG Interpretation None      MDM   Final diagnoses:  Pleural effusion  Pneumonitis   Patient is well-appearing. She describes a mechanical fall that would result in a twisting injury to the chest rather than a blunt trauma. Chest x-ray identifies an area of pleural effusion. This is present with some infiltrate appearing findings on CT of the chest. After discussing with the patient, she reports that she has been coughing up phlegm. There do not appear to be any fractures or chest wall contusion. At this time I feel that this effusion was pre-existing and may represent a pulmonary infection. The patient will be treated for a community-acquired pneumonia and advised to follow-up for recheck. She is otherwise well appearance and did not have positives on review  of systems. There is no evidence of serious traumatic injury.    Arby BarretteMarcy Tamim Skog, MD 10/05/14 [id number]  Arby BarretteMarcy Vanity Larsson, MD 10/05/14 [phone]

## 2014-10-05 MED ORDER — LEVOFLOXACIN 500 MG PO TABS
500.0000 mg | ORAL_TABLET | Freq: Once | ORAL | Status: AC
  Administered 2014-10-05: 500 mg via ORAL
  Filled 2014-10-05: qty 1

## 2014-10-05 MED ORDER — TRAMADOL HCL 50 MG PO TABS: 50.0000 mg | ORAL_TABLET | Freq: Four times a day (QID) | ORAL | Status: DC | PRN

## 2014-10-05 MED ORDER — LEVOFLOXACIN 500 MG PO TABS: 500.0000 mg | ORAL_TABLET | Freq: Every day | ORAL | Status: DC

## 2014-10-05 MED ORDER — ACETAMINOPHEN 500 MG PO TABS: 1000.0000 mg | ORAL_TABLET | Freq: Four times a day (QID) | ORAL | Status: DC | PRN

## 2014-10-05 NOTE — Discharge Instructions (Signed)

## 2015-12-06 ENCOUNTER — Emergency Department (HOSPITAL_COMMUNITY): Payer: Medicare FFS

## 2015-12-06 ENCOUNTER — Encounter (HOSPITAL_COMMUNITY): Payer: Self-pay | Admitting: Emergency Medicine

## 2015-12-06 ENCOUNTER — Emergency Department (HOSPITAL_COMMUNITY)
Admission: EM | Admit: 2015-12-06 | Discharge: 2015-12-06 | Disposition: A | Discharge status: Home / Self Care | Payer: Medicare FFS | Attending: Emergency Medicine | Admitting: Emergency Medicine

## 2015-12-06 DIAGNOSIS — G309 Alzheimer's disease, unspecified: Secondary | ICD-10-CM | POA: Diagnosis not present

## 2015-12-06 DIAGNOSIS — S0083XA Contusion of other part of head, initial encounter: Secondary | ICD-10-CM | POA: Diagnosis not present

## 2015-12-06 DIAGNOSIS — S80212A Abrasion, left knee, initial encounter: Secondary | ICD-10-CM | POA: Diagnosis not present

## 2015-12-06 DIAGNOSIS — M542 Cervicalgia: Secondary | ICD-10-CM | POA: Diagnosis not present

## 2015-12-06 DIAGNOSIS — E871 Hypo-osmolality and hyponatremia: Secondary | ICD-10-CM | POA: Diagnosis not present

## 2015-12-06 DIAGNOSIS — Y999 Unspecified external cause status: Secondary | ICD-10-CM | POA: Insufficient documentation

## 2015-12-06 DIAGNOSIS — W19XXXA Unspecified fall, initial encounter: Secondary | ICD-10-CM | POA: Insufficient documentation

## 2015-12-06 DIAGNOSIS — S60812A Abrasion of left wrist, initial encounter: Secondary | ICD-10-CM | POA: Insufficient documentation

## 2015-12-06 DIAGNOSIS — Z79899 Other long term (current) drug therapy: Secondary | ICD-10-CM | POA: Insufficient documentation

## 2015-12-06 DIAGNOSIS — Y939 Activity, unspecified: Secondary | ICD-10-CM | POA: Insufficient documentation

## 2015-12-06 DIAGNOSIS — Y929 Unspecified place or not applicable: Secondary | ICD-10-CM | POA: Insufficient documentation

## 2015-12-06 DIAGNOSIS — M25532 Pain in left wrist: Secondary | ICD-10-CM

## 2015-12-06 DIAGNOSIS — F0280 Dementia in other diseases classified elsewhere without behavioral disturbance: Secondary | ICD-10-CM | POA: Diagnosis not present

## 2015-12-06 DIAGNOSIS — S0093XA Contusion of unspecified part of head, initial encounter: Secondary | ICD-10-CM

## 2015-12-06 DIAGNOSIS — M25562 Pain in left knee: Secondary | ICD-10-CM

## 2015-12-06 DIAGNOSIS — N390 Urinary tract infection, site not specified: Secondary | ICD-10-CM | POA: Insufficient documentation

## 2015-12-06 LAB — COMPREHENSIVE METABOLIC PANEL
ALBUMIN: 4 g/dL (ref 3.5–5.0)
ALK PHOS: 50 U/L (ref 38–126)
ALT: 13 U/L — ABNORMAL LOW (ref 14–54)
ANION GAP: 9 (ref 5–15)
AST: 18 U/L (ref 15–41)
BUN: 10 mg/dL (ref 6–20)
CALCIUM: 10 mg/dL (ref 8.9–10.3)
CO2: 23 mmol/L (ref 22–32)
Chloride: 97 mmol/L — ABNORMAL LOW (ref 101–111)
Creatinine, Ser: 1.14 mg/dL — ABNORMAL HIGH (ref 0.44–1.00)
GFR calc Af Amer: 49 mL/min — ABNORMAL LOW (ref >60)
GFR calc non Af Amer: 43 mL/min — ABNORMAL LOW (ref >60)
GLUCOSE: 89 mg/dL (ref 65–99)
Potassium: 3.9 mmol/L (ref 3.5–5.1)
SODIUM: 129 mmol/L — AB (ref 135–145)
Total Bilirubin: 0.8 mg/dL (ref 0.3–1.2)
Total Protein: 7.1 g/dL (ref 6.5–8.1)

## 2015-12-06 LAB — CBC WITH DIFFERENTIAL/PLATELET
Basophils Absolute: 0 10*3/uL (ref 0.0–0.1)
Basophils Relative: 0 %
EOS ABS: 0.1 10*3/uL (ref 0.0–0.7)
Eosinophils Relative: 2 %
HEMATOCRIT: 35.1 % — AB (ref 36.0–46.0)
HEMOGLOBIN: 12.1 g/dL (ref 12.0–15.0)
LYMPHS ABS: 1.1 10*3/uL (ref 0.7–4.0)
LYMPHS PCT: 17 %
MCH: 32.4 pg (ref 26.0–34.0)
MCHC: 34.5 g/dL (ref 30.0–36.0)
MCV: 93.9 fL (ref 78.0–100.0)
MONOS PCT: 8 %
Monocytes Absolute: 0.5 10*3/uL (ref 0.1–1.0)
NEUTROS ABS: 4.7 10*3/uL (ref 1.7–7.7)
Neutrophils Relative %: 73 %
Platelets: 278 10*3/uL (ref 150–400)
RBC: 3.74 MIL/uL — AB (ref 3.87–5.11)
RDW: 13 % (ref 11.5–15.5)
WBC: 6.4 10*3/uL (ref 4.0–10.5)

## 2015-12-06 LAB — URINE MICROSCOPIC-ADD ON

## 2015-12-06 LAB — URINALYSIS, ROUTINE W REFLEX MICROSCOPIC
Bilirubin Urine: NEGATIVE
Glucose, UA: NEGATIVE mg/dL
HGB URINE DIPSTICK: NEGATIVE
Ketones, ur: NEGATIVE mg/dL
Nitrite: POSITIVE — AB
PH: 7 (ref 5.0–8.0)
Protein, ur: NEGATIVE mg/dL
SPECIFIC GRAVITY, URINE: 1.011 (ref 1.005–1.030)

## 2015-12-06 LAB — CK: Total CK: 61 U/L (ref 38–234)

## 2015-12-06 MED ORDER — CEPHALEXIN 500 MG PO CAPS: 500.0000 mg | ORAL_CAPSULE | Freq: Two times a day (BID) | ORAL | 0 refills | Status: DC

## 2015-12-06 MED ORDER — CEPHALEXIN 500 MG PO CAPS
500.0000 mg | ORAL_CAPSULE | Freq: Once | ORAL | Status: AC
  Administered 2015-12-06: 500 mg via ORAL
  Filled 2015-12-06: qty 1

## 2015-12-06 NOTE — ED Notes (Signed)
Patient transported to X-ray 

## 2015-12-06 NOTE — ED Notes (Signed)
[facility] notified of transportation need 

## 2015-12-06 NOTE — ED Notes (Signed)
Bed: WA03 Expected date:  Expected time:  Means of arrival:  Comments: Fall, knee injury, 82 yr old

## 2015-12-06 NOTE — ED Triage Notes (Signed)
Pt comes ed via ems, c/o unwitnessed fall, last night and found on floor this morning. Hx of dementia.  small hematoma noted on left temporal head area. Pt landed on her left knee small abrasion noted. Left hand skin tear noted. Pain complaint to her left sided/ on her neck pain.  Alert to her name, baseline per staff.   Medical hx of HTN, Hyperthyroidism. Etc  Allergies none  V/s on arrival 106/78, 90 pulse/ irregular , 96 sp02 room air   [facility].  Contact responsible greg Delis [phone]

## 2015-12-06 NOTE — ED Notes (Signed)
Patient transported to radiology

## 2015-12-06 NOTE — Discharge Instructions (Signed)
Patient given first dose of antibiotics in the ED. Take the second dose this afternoon and twice a day for the remaining days. Please hold lisinopril until 9/11. Patient need to have repeat blood work to re assess low sodium in ED at that time. Follow up with PCP. May restart blood pressure pill 9/11 after she has blood work preformed. No fracture seen on xray, however, a benign finding of the left distal femur. She needs to have another xray of left femur and knee in 6 months to check for stability. Please return to the ED if patient's symptoms worsen.

## 2015-12-06 NOTE — ED Provider Notes (Signed)
[facility]-EMERGENCY DEPT Provider Note   CSN: [id number] Arrival date & time: 12/06/15  4098     History   Chief Complaint Chief Complaint  Patient presents with  . Fall    hematoma to left temp lobe   . Knee Pain    2 abrasions superfical   . Neck Pain    HPI Renee Kramer is a 82 y.o. female.  82 year old Caucasian female past medical history significant for Alzheimer's and dementia presents to the ED by EMS this morning after sustaining a fall sometime last evening. Patient is currently residing at a nursing facility. She had an unwitnessed fall last night. She was found before this morning by staff. Patient was noted to have a small hematoma on her left temporal. She also has 2 small abrasions to her left knee and left wrist. Patient states that she has pain in her head, neck, left knee. Patient is alert to her name. Per staff this is baseline for patient. Patient is not on blood thinners. Patient thinks that she slipped on something last night and fell. Denies any dizziness or lightheadedness. Denies any fever, chills, nausea, vomiting, chest pain, shortness of breath, abdominal pain, urinary symptoms, change in bowel habits. She is not on blood thinners.      Past Medical History:  Diagnosis Date  . Alzheimer disease   . Dementia     There are no active problems to display for this patient.   History reviewed. No pertinent surgical history.  OB History    No data available       Home Medications    Prior to Admission medications   Medication Sig Start Date End Date Taking? Authorizing Provider  acetaminophen (TYLENOL) 325 MG tablet Take 325 mg by mouth 2 (two) times daily.     Historical Provider, MD  acetaminophen (TYLENOL) 500 MG tablet Take 2 tablets (1,000 mg total) by mouth every 6 (six) hours as needed. 10/05/14   Arby Barrette, MD  alendronate (FOSAMAX) 35 MG tablet Take 35 mg by mouth every 7 (seven) days. On Fridays    Historical Provider, MD  cetirizine  (ZYRTEC) 10 MG tablet Take 10 mg by mouth at bedtime.    Historical Provider, MD  cholecalciferol (VITAMIN D) 1000 UNITS tablet Take 1,000 Units by mouth daily.    Historical Provider, MD  clobetasol ointment (TEMOVATE) 0.05 % Apply 1 application topically 2 (two) times daily as needed.    Historical Provider, MD  Cranberry 475 MG CAPS Take 1 capsule by mouth 2 (two) times daily.    Historical Provider, MD  donepezil (ARICEPT) 5 MG tablet Take 5 mg by mouth at bedtime.    Historical Provider, MD  ferrous sulfate 325 (65 FE) MG tablet Take 325 mg by mouth at bedtime.    Historical Provider, MD  gabapentin (NEURONTIN) 100 MG capsule Take 200 mg by mouth 2 (two) times daily.    Historical Provider, MD  guaifenesin (ROBITUSSIN) 100 MG/5ML syrup Take 200 mg by mouth every 6 (six) hours as needed for cough.    Historical Provider, MD  levofloxacin (LEVAQUIN) 500 MG tablet Take 1 tablet (500 mg total) by mouth daily. 10/05/14   Arby Barrette, MD  lisinopril (PRINIVIL,ZESTRIL) 5 MG tablet Take 5 mg by mouth daily.    Historical Provider, MD  loperamide (IMODIUM) 2 MG capsule Take 2 mg by mouth as needed for diarrhea or loose stools.    Historical Provider, MD  Melatonin 1 MG TABS Take 2 tablets  by mouth at bedtime.    Historical Provider, MD  methimazole (TAPAZOLE) 10 MG tablet Take 10 mg by mouth 3 (three) times daily.    Historical Provider, MD  nystatin cream (MYCOSTATIN) Apply 1 application topically 2 (two) times daily as needed for dry skin.    Historical Provider, MD  senna (SENOKOT) 8.6 MG tablet Take 2 tablets by mouth every morning.     Historical Provider, MD  sertraline (ZOLOFT) 50 MG tablet Take 50 mg by mouth every morning.    Historical Provider, MD    Family History No family history on file.  Social History Social History  Substance Use Topics  . Smoking status: Never Smoker  . Smokeless tobacco: Not on file  . Alcohol use No     Allergies   Review of patient's allergies  indicates no known allergies.   Review of Systems Review of Systems  Constitutional: Negative for chills and fever.  HENT: Negative for congestion, ear pain and sore throat.   Eyes: Negative for pain and visual disturbance.  Respiratory: Negative for cough and shortness of breath.   Cardiovascular: Negative for chest pain, palpitations and leg swelling.  Gastrointestinal: Negative for abdominal pain, diarrhea, nausea and vomiting.  Genitourinary: Negative for dysuria, flank pain, frequency, hematuria and urgency.  Musculoskeletal: Positive for neck pain. Negative for arthralgias and back pain.  Skin: Negative for color change and rash.  Neurological: Positive for headaches. Negative for dizziness, syncope, weakness, light-headedness and numbness.  All other systems reviewed and are negative.    Physical Exam Updated Vital Signs BP 123/65 (BP Location: Right Arm)   Pulse 73   Temp 97.6 F (36.4 C) (Oral)   Resp 18   SpO2 98%   Physical Exam  Constitutional: She appears well-developed and well-nourished. No distress.  Patient resting comfortably in the bed.  HENT:  Head: Normocephalic.  Right Ear: External ear normal.  Left Ear: External ear normal.  Mouth/Throat: Oropharynx is clear and moist.  Small hematoma noted to left temporal head  Eyes: Conjunctivae and EOM are normal. Pupils are equal, round, and reactive to light. Right eye exhibits no discharge. Left eye exhibits no discharge. No scleral icterus.  Neck: Normal range of motion. Neck supple. No thyromegaly present.  Tenderness to the left c-spine Paraspinous muscles.  Cardiovascular: Normal rate, regular rhythm, normal heart sounds and intact distal pulses.   Pulmonary/Chest: Effort normal and breath sounds normal.  Abdominal: Soft. Bowel sounds are normal. She exhibits no distension. There is no tenderness. There is no rebound and no guarding.  Musculoskeletal: Normal range of motion.  Pelvis is stable. No pain  with palpation of hip joints bilaterality. Full ROM of left knee and wrist. No deformity or crepitus appreciated. Small abrasion to left knee and left wrist with bleeding controlled.   Lymphadenopathy:    She has no cervical adenopathy.  Neurological: She is alert. She has normal strength. No sensory deficit.  Oriented to person. [facility]. Patient with history of dementia unable to stay focused to follow commands. Unable to get accurate assessment of cranial nerves. Unable to assess coordination.  Skin: Skin is warm and dry. Capillary refill takes less than 2 seconds.  Psychiatric:  Patient is demented   Nursing note and vitals reviewed.    ED Treatments / Results  Labs (all labs ordered are listed, but only abnormal results are displayed) Labs Reviewed  COMPREHENSIVE METABOLIC PANEL - Abnormal; Notable for the following:  Result Value   Sodium 129 (*)    Chloride 97 (*)    Creatinine, Ser 1.14 (*)    ALT 13 (*)    GFR calc non Af Amer 43 (*)    GFR calc Af Amer 49 (*)    All other components within normal limits  CBC WITH DIFFERENTIAL/PLATELET - Abnormal; Notable for the following:    RBC 3.74 (*)    HCT 35.1 (*)    All other components within normal limits  URINALYSIS, ROUTINE W REFLEX MICROSCOPIC (NOT AT [facility]) - Abnormal; Notable for the following:    APPearance CLOUDY (*)    Nitrite POSITIVE (*)    Leukocytes, UA TRACE (*)    All other components within normal limits  URINE MICROSCOPIC-ADD ON - Abnormal; Notable for the following:    Squamous Epithelial / LPF 0-5 (*)    Bacteria, UA MANY (*)    All other components within normal limits  URINE CULTURE  CK    EKG  EKG Interpretation  Date/Time:  Friday December 06 2015 07:46:34 EDT Ventricular Rate:  72 PR Interval:    QRS Duration: 84 QT Interval:  375 QTC Calculation: 411 R Axis:   66 Text Interpretation:  Sinus rhythm Abnormal R-wave progression, early  transition Confirmed by Juleen China  MD, STEPHEN [phone]) on 12/06/2015 7:50:23 AM       Radiology Dg Wrist Complete Left  Result Date: 12/06/2015 CLINICAL DATA:  Recent fall with wrist pain, initial encounter EXAM: LEFT WRIST - COMPLETE 3+ VIEW COMPARISON:  None. FINDINGS: There is no evidence of fracture or dislocation. There is no evidence of arthropathy or other focal bone abnormality. Soft tissues are unremarkable. IMPRESSION: No acute abnormality noted. Electronically Signed   By: Alcide Clever M.D.   On: 12/06/2015 07:07   Ct Head Wo Contrast  Result Date: 12/06/2015 CLINICAL DATA:  Un witnessed fall with left temporal hematoma EXAM: CT HEAD WITHOUT CONTRAST CT CERVICAL SPINE WITHOUT CONTRAST TECHNIQUE: Multidetector CT imaging of the head and cervical spine was performed following the standard protocol without intravenous contrast. Multiplanar CT image reconstructions of the cervical spine were also generated. COMPARISON:  None. FINDINGS: CT HEAD FINDINGS Brain: Diffuse atrophic changes are noted. Areas of chronic white matter ischemic change are seen. No findings to suggest acute hemorrhage or acute infarction are noted. Vascular: No hyperdense vessel or unexpected calcification. Skull: Normal. Negative for fracture or focal lesion. Sinuses/Orbits: Mild ethmoid sinus disease on the left. A small mucosal retention cyst is noted in the sphenoid sinus on the left. CT CERVICAL SPINE FINDINGS Alignment: Within normal limits. Skull base and vertebrae: 7 cervical segments are well visualized. 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 Facet hypertrophic changes are noted at multiple levels. No acute fracture is seen. Soft tissues and spinal canal: Carotid calcifications are seen. The visualized soft tissues are otherwise within normal limits. Disc levels: Disc space narrowing at C5-6 and C6-7 with anterior posterior osteophytes is noted. Upper chest: Within normal limits. Some scarring  is noted in the apices bilaterally. IMPRESSION: CT of the head: Mild sinus disease. No acute intracranial abnormality is noted. Chronic ischemic and atrophic changes as described. CT of the cervical spine: Degenerative change without acute abnormality. Electronically Signed   By: Alcide Clever M.D.   On: 12/06/2015 07:42   Ct Cervical Spine Wo Contrast  Result Date: 12/06/2015 CLINICAL DATA:  Un witnessed fall with left temporal hematoma EXAM: CT HEAD WITHOUT  CONTRAST CT CERVICAL SPINE WITHOUT CONTRAST TECHNIQUE: Multidetector CT imaging of the head and cervical spine was performed following the standard protocol without intravenous contrast. Multiplanar CT image reconstructions of the cervical spine were also generated. COMPARISON:  None. FINDINGS: CT HEAD FINDINGS Brain: Diffuse atrophic changes are noted. Areas of chronic white matter ischemic change are seen. No findings to suggest acute hemorrhage or acute infarction are noted. Vascular: No hyperdense vessel or unexpected calcification. Skull: Normal. Negative for fracture or focal lesion. Sinuses/Orbits: Mild ethmoid sinus disease on the left. A small mucosal retention cyst is noted in the sphenoid sinus on the left. CT CERVICAL SPINE FINDINGS Alignment: Within normal limits. Skull base and vertebrae: 7 cervical segments are well visualized. 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 Facet hypertrophic changes are noted at multiple levels. No acute fracture is seen. Soft tissues and spinal canal: Carotid calcifications are seen. The visualized soft tissues are otherwise within normal limits. Disc levels: Disc space narrowing at C5-6 and C6-7 with anterior posterior osteophytes is noted. Upper chest: Within normal limits. Some scarring is noted in the apices bilaterally. IMPRESSION: CT of the head: Mild sinus disease. No acute intracranial abnormality is noted. Chronic ischemic and atrophic changes as described. CT of the cervical  spine: Degenerative change without acute abnormality. Electronically Signed   By: Alcide Clever M.D.   On: 12/06/2015 07:42   Dg Knee Complete 4 Views Left  Result Date: 12/06/2015 CLINICAL DATA:  Recent fall with knee pain, initial encounter EXAM: LEFT KNEE - COMPLETE 4+ VIEW COMPARISON:  None. FINDINGS: No acute fracture or dislocation is noted. An area of mixed sclerosis is noted in the lateral femoral metaphysis adjacent to the growth plate. This is likely benign in etiology possibly representing an enchondroma. No cortical destruction or periosteal reaction is seen. No other focal abnormality is noted. IMPRESSION: No acute bony abnormality is seen. There are changes noted in the distal lateral femoral metaphysis as described. These are likely benign in etiology. Six-month follow-up is recommended to assess for stability. Electronically Signed   By: Alcide Clever M.D.   On: 12/06/2015 07:06    Procedures Procedures (including critical care time)  Medications Ordered in ED Medications  cephALEXin (KEFLEX) capsule 500 mg (500 mg Oral Given 12/06/15 1610)     Initial Impression / Assessment and Plan / ED Course  I have reviewed the triage vital signs and the nursing notes.  Pertinent labs & imaging results that were available during my care of the patient were reviewed by me and considered in my medical decision making (see chart for details).  Clinical Course  Patient presents to the ED this morning with unwitnessed mechanical fall. Patient complains of headache and neck pain along with wrist and knee pain. X-rays were unremarkable. CT without acute abnormalities. UA consistent with UTI. We'll treat with Keflex. First dose given in ED. Sodium was noted to be 129 on exam. Syncope chronic for patient. Encouraged to hold lisinopril for the next 3 days. Follow up with PCP to have blood work retested. This could be contributory to her fall. Hemoglobin unremarkable. All labs are unremarkable. EKG was  unremarkable. Discussed follow-up with the nurse to give report to nursing home. Strict return precautions given. Tried to instruct patient with plan of care however difficult due to dementia. Hemodynamically stable. Discharged to nursing facility in no acute distress with stable vital signs. Discussed plan of care with Dr. Juleen China who agrees.  Final Clinical Impressions(s) / ED Diagnoses   Final diagnoses:  UTI (lower urinary tract infection)  Left wrist pain  Left knee pain  Fall, initial encounter  Neck pain  Head contusion, initial encounter  Hyponatremia    New Prescriptions New Prescriptions   No medications on file     Rise Mu, PA-C 12/06/15 1625    Melene Plan, DO 12/06/15 2301

## 2015-12-08 LAB — URINE CULTURE

## 2015-12-09 ENCOUNTER — Telehealth (HOSPITAL_COMMUNITY): Payer: Self-pay

## 2015-12-09 NOTE — Telephone Encounter (Signed)
Post ED Visit - Positive Culture Follow-up  Culture report reviewed by antimicrobial stewardship pharmacist:  []  Enzo BiNathan Batchelder, Pharm.D. []  Celedonio MiyamotoJeremy Frens, Pharm.D., BCPS []  Garvin FilaMike Maccia, Pharm.D. []  Georgina PillionElizabeth Martin, Pharm.D., BCPS []  Rio LajasMinh Pham, [place]., BCPS, AAHIVP []  Estella HuskMichelle Turner, Pharm.D., BCPS, AAHIVP []  Tennis Mustassie Stewart, Pharm.D. []  Sherle Poeob Vincent, [place]  Bernie CoveyX  Taylor Stone, Pharm.D.  Positive urine culture, >/= 100,000 colonies -> E Coli Treated with Cephalexin, organism sensitive to the same and no further patient follow-up is required at this time.  Arvid RightClark, Ka Flammer Dorn 12/09/2015, 12:56 PM

## 2015-12-14 ENCOUNTER — Encounter (HOSPITAL_COMMUNITY): Payer: Self-pay | Admitting: Emergency Medicine

## 2015-12-14 ENCOUNTER — Emergency Department (HOSPITAL_COMMUNITY): Payer: Medicare FFS

## 2015-12-14 ENCOUNTER — Emergency Department (HOSPITAL_COMMUNITY)
Admission: EM | Admit: 2015-12-14 | Discharge: 2015-12-14 | Disposition: A | Discharge status: Home / Self Care | Payer: Medicare FFS | Attending: Emergency Medicine | Admitting: Emergency Medicine

## 2015-12-14 DIAGNOSIS — Z79899 Other long term (current) drug therapy: Secondary | ICD-10-CM | POA: Diagnosis not present

## 2015-12-14 DIAGNOSIS — Y92129 Unspecified place in nursing home as the place of occurrence of the external cause: Secondary | ICD-10-CM | POA: Insufficient documentation

## 2015-12-14 DIAGNOSIS — S0101XA Laceration without foreign body of scalp, initial encounter: Secondary | ICD-10-CM

## 2015-12-14 DIAGNOSIS — G309 Alzheimer's disease, unspecified: Secondary | ICD-10-CM | POA: Insufficient documentation

## 2015-12-14 DIAGNOSIS — Z23 Encounter for immunization: Secondary | ICD-10-CM | POA: Diagnosis not present

## 2015-12-14 DIAGNOSIS — Y999 Unspecified external cause status: Secondary | ICD-10-CM | POA: Insufficient documentation

## 2015-12-14 DIAGNOSIS — S0990XA Unspecified injury of head, initial encounter: Secondary | ICD-10-CM | POA: Diagnosis present

## 2015-12-14 DIAGNOSIS — Y939 Activity, unspecified: Secondary | ICD-10-CM | POA: Insufficient documentation

## 2015-12-14 DIAGNOSIS — W01119A Fall on same level from slipping, tripping and stumbling with subsequent striking against unspecified sharp object, initial encounter: Secondary | ICD-10-CM | POA: Insufficient documentation

## 2015-12-14 DIAGNOSIS — W19XXXA Unspecified fall, initial encounter: Secondary | ICD-10-CM

## 2015-12-14 MED ORDER — LIDOCAINE-EPINEPHRINE (PF) 2 %-1:200000 IJ SOLN
20.0000 mL | Freq: Once | INTRAMUSCULAR | Status: AC
  Administered 2015-12-14: 20 mL
  Filled 2015-12-14: qty 20

## 2015-12-14 MED ORDER — TETANUS-DIPHTH-ACELL PERTUSSIS 5-2.5-18.5 LF-MCG/0.5 IM SUSP
0.5000 mL | Freq: Once | INTRAMUSCULAR | Status: AC
  Administered 2015-12-14: 0.5 mL via INTRAMUSCULAR
  Filled 2015-12-14: qty 0.5

## 2015-12-14 MED ORDER — ACETAMINOPHEN 325 MG PO TABS
650.0000 mg | ORAL_TABLET | Freq: Once | ORAL | Status: AC
  Administered 2015-12-14: 650 mg via ORAL
  Filled 2015-12-14: qty 2

## 2015-12-14 NOTE — ED Notes (Signed)
Bed: [id number] Expected date:  Expected time:  Means of arrival:  Comments: EMS fall/LSB

## 2015-12-14 NOTE — ED Notes (Signed)
[facility] Facility asking for an xray of her L hip, sts pt was screaming in pain and holding left hip after initial fall. No obvious deformity or bruising noted to L side on assessment. MD made aware and xray ordered.

## 2015-12-14 NOTE — Discharge Instructions (Signed)

## 2015-12-14 NOTE — ED Provider Notes (Signed)
Emergency Department Provider Note   I have reviewed the triage vital signs and the nursing notes.   HISTORY  Chief Complaint Fall   HPI Renee Kramer is a 82 y.o. female with PMH of alzheimer's disease resents emergency department from her skilled nursing facility after mechanical fall. Per EMS the patient was being assisted out of bed when she fell forward striking her head. They report no loss of consciousness. No vomiting in route. Patient appears to be at mental status baseline per EMS who discussed with nursing staff.  The patient has significant dementia and is unable to provide any HPI and ROS.   Past Medical History:  Diagnosis Date  . Alzheimer disease   . Dementia     There are no active problems to display for this patient.   History reviewed. No pertinent surgical history.  Current Outpatient Rx  . Order #: [id number] Class: Historical Med  . Order #: [id number] Class: Historical Med  . Order #: [id number] Class: Historical Med  . Order #: [id number] Class: Historical Med  . Order #: [id number] Class: Historical Med  . Order #: [id number] Class: Historical Med  . Order #: [id number] Class: Historical Med  . Order #: [id number] Class: Historical Med  . Order #: [id number] Class: Historical Med  . Order #: [id number] Class: Historical Med  . Order #: [id number] Class: Historical Med  . Order #: [id number] Class: Historical Med  . Order #: [id number] Class: Historical Med  . Order #: [id number] Class: Historical Med  . Order #: [id number] Class: Historical Med  . Order #: [id number] Class: Historical Med    Allergies Review of patient's allergies indicates no known allergies.  History reviewed. No pertinent family history.  Social History Social History  Substance Use Topics  . Smoking status: Never Smoker  . Smokeless tobacco: Never Used  . Alcohol use No    Review of Systems  Patient with significant dementia.  ____________________________________________   PHYSICAL  EXAM:  VITAL SIGNS: ED Triage Vitals  Enc Vitals Group     BP 12/14/15 1326 153/78     Pulse Rate 12/14/15 1326 96     Resp 12/14/15 1326 18     Temp 12/14/15 1327 98.8 F (37.1 C)     Temp Source 12/14/15 1327 Oral     SpO2 12/14/15 1326 99 %     Weight 12/14/15 1333 150 lb (68 kg)     Height 12/14/15 1333 5\' 8"  (1.727 m)    Constitutional: Alert and trying to climb out of bed. She is redirectable. Eyes: Conjunctivae are normal.  Head: Dried blood and 7 cm "V" shaped laceration to crown of head. Wound is hemostatic. No galea involvement.  Nose: No congestion/rhinnorhea. Mouth/Throat: Mucous membranes are moist.  Oropharynx non-erythematous. Neck: No stridor. No cervical spine tenderness to palpation. Cardiovascular: Normal rate, regular rhythm. Good peripheral circulation. Grossly normal heart sounds.   Respiratory: Normal respiratory effort.  No retractions. Lungs CTAB. Gastrointestinal: Soft and nontender. No distention.  Musculoskeletal: No lower extremity tenderness nor edema. No gross deformities of extremities. Full ROM of both hips and knees.  Neurologic:  Normal speech and language. No gross focal neurologic deficits are appreciated.  Skin:  Skin is warm, dry and intact. No rash noted. Psychiatric: Mood and affect are normal. Speech and behavior are normal. ____________________________________________  EKG  None ____________________________________________  RADIOLOGY  Ct Head Wo Contrast  Result Date: 12/14/2015 CLINICAL DATA:  Per EMS pt fell at [facility] senior living facility witnessed by facility staff. Pt was being assisted out of bed by staff to  go to lunch and fell hitting her head on the headboard of her bed. Pt has laceration on her head and lacerations below both knees. No loss of consciousness reported. Pt is not currently on any blood thinners*pt has dementia and having trouble holding still EXAM: CT HEAD WITHOUT CONTRAST CT CERVICAL SPINE WITHOUT CONTRAST  TECHNIQUE: Multidetector CT imaging of the head and cervical spine was performed following the standard protocol without intravenous contrast. Multiplanar CT image reconstructions of the cervical spine were also generated. COMPARISON:  12/06/2015 FINDINGS: CT HEAD FINDINGS Brain: The ventricles are normal configuration. There is ventricular and sulcal enlargement reflecting moderate atrophy. No hydrocephalus. There are no parenchymal masses or mass effect. There is no evidence of a cortical infarct. Patchy white matter hypoattenuation is noted consistent with mild to moderate chronic microvascular ischemic change. No extra-axial masses or abnormal fluid collections. There is no intracranial hemorrhage. Vascular: No hyperdense vessel or unexpected calcification. Skull: Normal. Negative for fracture or focal lesion. Sinuses/Orbits: Status post cataract surgery. Globes and orbits otherwise unremarkable. Single left ethmoid air cell is opacified. Sinuses otherwise clear. Clear mastoid air cells. 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 No fracture. No bone lesion. Soft tissues and spinal canal: Scattered vascular calcifications. Mild heterogeneity of a normal-sized thyroid. No mass or adenopathy. No spinal canal mass or convincing disc herniation. Mild central stenosis at C5-C6 from diffuse spondylotic disc bulging. Disc levels: Moderate loss of disc height at C5-C6 and C6-C7. Endplate spurring at these levels is noted. There is moderate right neural foraminal narrowing at C5-C6. Facet degenerative change is most prominent on the left at C3-C4. Upper chest: Mild apical pleural parenchymal scarring. Otherwise unremarkable. Other: None IMPRESSION: HEAD CT: No acute intracranial abnormalities. Atrophy and chronic microvascular ischemic change stable from the recent prior study. CERVICAL CT:  No fracture or acute finding.  Electronically Signed   By: Amie Portlandavid  Ormond M.D.   On: 12/14/2015 15:01   Ct Cervical Spine Wo Contrast  Result Date: 12/14/2015 CLINICAL DATA:  [facility] EMS [facility] by facility staff. Pt was being assisted out of bed by staff to go to lunch and fell hitting her head on the headboard of her bed. Pt has laceration on her head and lacerations below both knees. No loss of consciousness reported. Pt is not currently on any blood thinners*pt has dementia and having trouble holding still EXAM: CT HEAD WITHOUT CONTRAST CT CERVICAL SPINE WITHOUT CONTRAST TECHNIQUE: Multidetector CT imaging of the head and cervical spine was performed following the standard protocol without intravenous contrast. Multiplanar CT image reconstructions of the cervical spine were also generated. COMPARISON:  12/06/2015 FINDINGS: CT HEAD FINDINGS Brain: The ventricles are normal configuration. There is ventricular and sulcal enlargement reflecting moderate atrophy. No hydrocephalus. There are no parenchymal masses or mass effect. There is no evidence of a cortical infarct. Patchy white matter hypoattenuation is noted consistent with mild to moderate chronic microvascular ischemic change. No extra-axial masses or abnormal fluid collections. There is no intracranial hemorrhage. Vascular: No hyperdense vessel or unexpected calcification. Skull: Normal. Negative for fracture or focal lesion. Sinuses/Orbits: Status post cataract surgery. Globes and orbits otherwise unremarkable. Single left ethmoid air cell is opacified. Sinuses otherwise clear. Clear mastoid air cells. 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 No fracture. No bone lesion. Soft tissues and  spinal canal: Scattered vascular calcifications. Mild heterogeneity of a normal-sized thyroid. No mass or adenopathy. No spinal canal mass or convincing disc  herniation. Mild central stenosis at C5-C6 from diffuse spondylotic disc bulging. Disc levels: Moderate loss of disc height at C5-C6 and C6-C7. Endplate spurring at these levels is noted. There is moderate right neural foraminal narrowing at C5-C6. Facet degenerative change is most prominent on the left at C3-C4. Upper chest: Mild apical pleural parenchymal scarring. Otherwise unremarkable. Other: None IMPRESSION: HEAD CT: No acute intracranial abnormalities. Atrophy and chronic microvascular ischemic change stable from the recent prior study. CERVICAL CT:  No fracture or acute finding. Electronically Signed   By: Amie Portland M.D.   On: 12/14/2015 15:01   Dg Hip Unilat With Pelvis 2-3 Views Left  Result Date: 12/14/2015 CLINICAL DATA:  Left hip pain after fall today while getting out of bed. EXAM: DG HIP (WITH OR WITHOUT PELVIS) 2-3V LEFT COMPARISON:  None. FINDINGS: There is diffuse decreased bone mineralization. There are mild symmetric degenerative changes of the hips. There is no acute fracture or dislocation. There are degenerative changes of the spine. IMPRESSION: No acute findings. Mild symmetric degenerative change of the hips. Electronically Signed   By: Elberta Fortis M.D.   On: 12/14/2015 17:32    ____________________________________________   PROCEDURES  Procedure(s) performed:   Marland KitchenMarland KitchenLaceration Repair Date/Time: 12/14/2015 3:41 PM Performed by: Cheikh Bramble G Authorized by: Alona Bene G   Laceration details:    Location:  Scalp   Scalp location:  Crown   Length (cm):  7   Depth (mm):  5 Repair type:    Repair type:  Simple Pre-procedure details:    Preparation:  Patient was prepped and draped in usual sterile fashion Exploration:    Hemostasis achieved with:  Direct pressure   Wound exploration: entire depth of wound probed and visualized     Wound extent: no foreign bodies/material noted     Contaminated: no   Treatment:    Area cleansed with:  Saline   Amount of  cleaning:  Extensive   Visualized foreign bodies/material removed: no   Skin repair:    Repair method:  Staples   Number of staples:  6 Approximation:    Approximation:  Close   Vermilion border: well-aligned   Post-procedure details:    Dressing:  Open (no dressing)   Patient tolerance of procedure:  Tolerated well, no immediate complications   ____________________________________________   INITIAL IMPRESSION / ASSESSMENT AND PLAN / ED COURSE  Pertinent labs & imaging results that were available during my care of the patient were reviewed by me and considered in my medical decision making (see chart for details).  Patient resents [facility] department for evaluation of mechanical fall. She has an 8 cm flap scalp laceration. The wound is hemostatic and reasonably well approximated at this time. No clear galeal injury. It is not on blood thinners. Will obtain CT scan of the head and neck and repair the laceration with staples.   04:20 PM Updated patient's son at bedside. Nursing home apparently concerned regarding left hip pain. Will x-ray that area. Patient appears to be moving hip without difficulty when getting on bed pan.   05:43 PM X-ray of hip with positive only for symmetrical degenerative hip changes. No acute fracture or dislocation.  ____________________________________________  FINAL CLINICAL IMPRESSION(S) / ED DIAGNOSES  Final diagnoses:  Fall, initial encounter  Scalp laceration, initial encounter     MEDICATIONS GIVEN DURING THIS VISIT:  Medications  lidocaine-EPINEPHrine (XYLOCAINE W/EPI) 2 %-1:200000 (PF) injection 20 mL (20 mLs Infiltration Given 12/14/15 1412)  Tdap (BOOSTRIX) injection 0.5 mL (0.5 mLs Intramuscular Given 12/14/15 1412)  acetaminophen (TYLENOL) tablet 650 mg (650 mg Oral Given 12/14/15 1610)     NEW OUTPATIENT MEDICATIONS STARTED DURING THIS VISIT:  None   Note:  This document was prepared using Dragon voice recognition software and may  include unintentional dictation errors.  Alona Bene, MD Emergency Medicine    Maia Plan, MD 12/14/15 [phone]

## 2015-12-14 NOTE — ED Triage Notes (Addendum)
Per EMS [facility] by facility staff. Pt was being assisted out of bed by staff to go to lunch and fell hitting her head on the headboard of her bed. Pt has laceration on her head and lacerations below both knees. No loss of consciousness reported. Pt is not currently on any blood thinners.

## 2015-12-14 NOTE — ED Notes (Signed)
Bed: [facility]ALA Expected date:  Expected time:  Means of arrival:  Comments: 

## 2016-01-16 ENCOUNTER — Emergency Department (HOSPITAL_COMMUNITY): Payer: Medicare FFS

## 2016-01-16 ENCOUNTER — Encounter (HOSPITAL_COMMUNITY): Payer: Self-pay | Admitting: Emergency Medicine

## 2016-01-16 ENCOUNTER — Emergency Department (HOSPITAL_COMMUNITY)
Admission: EM | Admit: 2016-01-16 | Discharge: 2016-01-16 | Disposition: A | Discharge status: Home / Self Care | Payer: Medicare FFS | Attending: Emergency Medicine | Admitting: Emergency Medicine

## 2016-01-16 ENCOUNTER — Other Ambulatory Visit (HOSPITAL_COMMUNITY): Payer: Self-pay | Admitting: Radiology

## 2016-01-16 DIAGNOSIS — Y999 Unspecified external cause status: Secondary | ICD-10-CM | POA: Insufficient documentation

## 2016-01-16 DIAGNOSIS — S0101XA Laceration without foreign body of scalp, initial encounter: Secondary | ICD-10-CM | POA: Insufficient documentation

## 2016-01-16 DIAGNOSIS — Y9341 Activity, dancing: Secondary | ICD-10-CM | POA: Diagnosis not present

## 2016-01-16 DIAGNOSIS — G309 Alzheimer's disease, unspecified: Secondary | ICD-10-CM | POA: Insufficient documentation

## 2016-01-16 DIAGNOSIS — Y929 Unspecified place or not applicable: Secondary | ICD-10-CM | POA: Insufficient documentation

## 2016-01-16 DIAGNOSIS — W228XXA Striking against or struck by other objects, initial encounter: Secondary | ICD-10-CM | POA: Insufficient documentation

## 2016-01-16 DIAGNOSIS — W19XXXA Unspecified fall, initial encounter: Secondary | ICD-10-CM

## 2016-01-16 MED ORDER — LIDOCAINE-EPINEPHRINE (PF) 1 %-1:200000 IJ SOLN
30.0000 mL | Freq: Once | INTRAMUSCULAR | Status: AC
  Administered 2016-01-16: 30 mL via INTRADERMAL
  Filled 2016-01-16: qty 30

## 2016-01-16 MED ORDER — ACETAMINOPHEN 325 MG PO TABS
650.0000 mg | ORAL_TABLET | Freq: Once | ORAL | Status: AC
  Administered 2016-01-16: 650 mg via ORAL
  Filled 2016-01-16: qty 2

## 2016-01-16 NOTE — ED Provider Notes (Signed)
[facility]-EMERGENCY DEPT Provider Note   CSN: [id number] Arrival date & time: 01/16/16  1132     History   Chief Complaint Chief Complaint  Patient presents with  . Fall    Laceration to the head    HPI Renee Kramer is a 82 y.o. female.  LEVEL 5 CAVEAT-DEMENTIA  HPI Renee Kramer is a 82 y.o. female with PMH significant for Alzheimer disease who presents with unwitnessed fall at [facility] facility.  Has 2-3 cm linear laceration to crown of her head.  She has no complaints at this time.   Spoke with facility regarding fall. Patient was ambulating with her walker in dancing when she lost her balance causing her to fall backwards and strike her head approximately 10:30 AM. No loss of consciousness. No preceding symptoms. No chest pain, shortness of breath, dizziness, or syncope. Normally ambulates with a walker. She was immediately assisted up. She has been in her usual state of health. She is not on anticoagulants.  Past Medical History:  Diagnosis Date  . Alzheimer disease   . Dementia     There are no active problems to display for this patient.   History reviewed. No pertinent surgical history.  OB History    No data available       Home Medications    Prior to Admission medications   Medication Sig Start Date End Date Taking? Authorizing Provider  acetaminophen (TYLENOL) 325 MG tablet Take 325 mg by mouth 2 (two) times daily.    Yes Historical Provider, MD  alendronate (FOSAMAX) 35 MG tablet Take 35 mg by mouth every Thursday.    Yes Historical Provider, MD  calcium carbonate (OS-CAL - DOSED IN MG OF ELEMENTAL CALCIUM) 1250 (500 Ca) MG tablet Take 1 tablet by mouth 2 (two) times daily.   Yes Historical Provider, MD  cetirizine (ZYRTEC) 10 MG tablet Take 10 mg by mouth at bedtime.   Yes Historical Provider, MD  cholecalciferol (VITAMIN D) 1000 UNITS tablet Take 1,000 Units by mouth daily with breakfast.    Yes Historical Provider, MD  ciprofloxacin  (CIPRO) 250 MG tablet Take 250 mg by mouth 2 (two) times daily. Started 10/17 will take twice a day until 10/20   Yes Historical Provider, MD  clobetasol ointment (TEMOVATE) 0.05 % Apply 1 application topically every 6 (six) hours as needed (rash).    Yes Historical Provider, MD  Cranberry 450 MG TABS Take 450 mg by mouth daily with breakfast.    Yes Historical Provider, MD  donepezil (ARICEPT) 10 MG tablet Take 10 mg by mouth at bedtime.   Yes Historical Provider, MD  ENSURE (ENSURE) Take 237 mLs by mouth 3 (three) times daily between meals.   Yes Historical Provider, MD  fluticasone (FLONASE) 50 MCG/ACT nasal spray Place 2 sprays into both nostrils daily.   Yes Historical Provider, MD  megestrol (MEGACE) 400 MG/10ML suspension Take 400 mg by mouth 2 (two) times daily.   Yes Historical Provider, MD  Melatonin 1 MG TABS Take 2 mg by mouth at bedtime.    Yes Historical Provider, MD  Multiple Vitamin (TAB-A-VITE) TABS Take 1 tablet by mouth daily with breakfast.   Yes Historical Provider, MD  saccharomyces boulardii (FLORASTOR) 250 MG capsule Take 250 mg by mouth 2 (two) times daily. Started on 10/13 will take twice a day until 10/22   Yes Historical Provider, MD  senna (SENOKOT) 8.6 MG tablet Take 2 tablets by mouth every morning.    Yes Historical  Provider, MD  sertraline (ZOLOFT) 50 MG tablet Take 50 mg by mouth every morning.   Yes Historical Provider, MD    Family History No family history on file.  Social History Social History  Substance Use Topics  . Smoking status: Never Smoker  . Smokeless tobacco: Never Used  . Alcohol use No     Allergies   Review of patient's allergies indicates no known allergies.   Review of Systems Review of Systems  Unable to perform ROS: Dementia     Physical Exam Updated Vital Signs BP 127/77 (BP Location: Right Arm)   Pulse 87   Temp 97.4 F (36.3 C)   Resp 16   Ht 5\' 4"  (1.626 m)   Wt 63.5 kg   SpO2 95%   BMI 24.03 kg/m   Physical  Exam  Constitutional: She is oriented to person, place, and time. She appears well-developed and well-nourished.  Non-toxic appearance. She does not have a sickly appearance. She does not appear ill.  HENT:  Head: Normocephalic.  Mouth/Throat: Oropharynx is clear and moist.  2-3 cm laceration to crown.  Hemostatic.  No FB seen or palpated in a bloodless field.   Eyes: Conjunctivae are normal. Pupils are equal, round, and reactive to light.  Neck: Normal range of motion. Neck supple.  No cervical midline tenderness.   Cardiovascular: Normal rate and regular rhythm.   Pulmonary/Chest: Effort normal and breath sounds normal. No accessory muscle usage or stridor. No respiratory distress. She has no wheezes. She has no rhonchi. She has no rales.  Abdominal: Soft. Bowel sounds are normal. She exhibits no distension. There is no tenderness.  Musculoskeletal: Normal range of motion.  Moves all extremities spontaneously without difficulty.   Lymphadenopathy:    She has no cervical adenopathy.  Neurological: She is alert and oriented to person, place, and time. GCS eye subscore is 4. GCS verbal subscore is 5. GCS motor subscore is 6.  Speech clear without dysarthria.  Skin: Skin is warm and dry.  Psychiatric: She has a normal mood and affect. Her behavior is normal.     ED Treatments / Results  Labs (all labs ordered are listed, but only abnormal results are displayed) Labs Reviewed - No data to display  EKG  EKG Interpretation None       Radiology Ct Head Wo Contrast  Result Date: 01/16/2016 CLINICAL DATA:  Fall EXAM: CT HEAD WITHOUT CONTRAST CT CERVICAL SPINE WITHOUT CONTRAST TECHNIQUE: Multidetector CT imaging of the head and cervical spine was performed following the standard protocol without intravenous contrast. Multiplanar CT image reconstructions of the cervical spine were also generated. COMPARISON:  12/14/2015 FINDINGS: CT HEAD FINDINGS Brain: No intracranial hemorrhage, mass  effect or midline shift. No acute cortical infarction. Stable atrophy and chronic white matter disease. No mass lesion is noted on this unenhanced scan. Vascular: Atherosclerotic calcifications of carotid siphon. Skull: No skull fracture is noted. Sinuses/Orbits: There is mucosal thickening with partial opacification of the left ethmoid air cells. The mastoid air cells are unremarkable Other: None CT CERVICAL SPINE FINDINGS Alignment: The alignment is preserved. Skull base and vertebrae: No acute fracture or subluxation. Degenerative changes C1-C2 articulation. There is bony fusion C4-C5 vertebral body. Soft tissues and spinal canal: No prevertebral soft tissue swelling. Spinal canal is patent. Cervical airway is patent. Disc levels: Disc space flattening with mild anterior and mild posterior spurring at C5-C6 and C6-C7 level. No prevertebral soft tissue swelling. Cervical airway is patent. Upper chest: The visualized lung  apices shows no evidence of pneumothorax. Pleural parenchymal scarring noted bilateral lung apices. Other: None IMPRESSION: 1. No acute intracranial abnormality. Stable atrophy and chronic white matter disease. 2. No cervical spine acute fracture or subluxation. Multilevel degenerative changes as described above. Electronically Signed   By: Natasha MeadLiviu  Pop M.D.   On: 01/16/2016 13:23   Ct Cervical Spine Wo Contrast  Result Date: 01/16/2016 CLINICAL DATA:  Fall EXAM: CT HEAD WITHOUT CONTRAST CT CERVICAL SPINE WITHOUT CONTRAST TECHNIQUE: Multidetector CT imaging of the head and cervical spine was performed following the standard protocol without intravenous contrast. Multiplanar CT image reconstructions of the cervical spine were also generated. COMPARISON:  12/14/2015 FINDINGS: CT HEAD FINDINGS Brain: No intracranial hemorrhage, mass effect or midline shift. No acute cortical infarction. Stable atrophy and chronic white matter disease. No mass lesion is noted on this unenhanced scan. Vascular:  Atherosclerotic calcifications of carotid siphon. Skull: No skull fracture is noted. Sinuses/Orbits: There is mucosal thickening with partial opacification of the left ethmoid air cells. The mastoid air cells are unremarkable Other: None CT CERVICAL SPINE FINDINGS Alignment: The alignment is preserved. Skull base and vertebrae: No acute fracture or subluxation. Degenerative changes C1-C2 articulation. There is bony fusion C4-C5 vertebral body. Soft tissues and spinal canal: No prevertebral soft tissue swelling. Spinal canal is patent. Cervical airway is patent. Disc levels: Disc space flattening with mild anterior and mild posterior spurring at C5-C6 and C6-C7 level. No prevertebral soft tissue swelling. Cervical airway is patent. Upper chest: The visualized lung apices shows no evidence of pneumothorax. Pleural parenchymal scarring noted bilateral lung apices. Other: None IMPRESSION: 1. No acute intracranial abnormality. Stable atrophy and chronic white matter disease. 2. No cervical spine acute fracture or subluxation. Multilevel degenerative changes as described above. Electronically Signed   By: Natasha MeadLiviu  Pop M.D.   On: 01/16/2016 13:23    Procedures Procedures (including critical care time)  LACERATION REPAIR Performed by: Cheri FowlerKayla Nichole Keltner Consent: Verbal consent obtained. Risks and benefits: risks, benefits and alternatives were discussed Patient identity confirmed: provided demographic data Time out performed prior to procedure Prepped and Draped in normal sterile fashion Wound explored Laceration Location: crown Laceration Length: 2-3 cm No Foreign Bodies seen or palpated Anesthesia: local infiltration Local anesthetic: lidocaine 1% with epinephrine Anesthetic total: 7 ml Irrigation method: syringe Amount of cleaning: standard Skin closure: staple Number of sutures or staples: 4 Technique: staple Patient tolerance: Patient tolerated the procedure well with no immediate  complications.   Medications Ordered in ED Medications  lidocaine-EPINEPHrine (XYLOCAINE-EPINEPHrine) 1 %-1:200000 (PF) injection 30 mL (30 mLs Intradermal Given by Other 01/16/16 1252)  acetaminophen (TYLENOL) tablet 650 mg (650 mg Oral Given 01/16/16 1425)     Initial Impression / Assessment and Plan / ED Course  I have reviewed the triage vital signs and the nursing notes.  Pertinent labs & imaging results that were available during my care of the patient were reviewed by me and considered in my medical decision making (see chart for details).  Clinical Course   Patient presents with mechanical fall with scalp laceration. No focal neurological deficits. CT head and neck unremarkable. Laceration repaired with staples. Return in 7 days for suture removal. Tetanus is up-to-date. Stable for discharge.   Final Clinical Impressions(s) / ED Diagnoses   Final diagnoses:  Fall, initial encounter  Laceration of scalp, initial encounter    New Prescriptions New Prescriptions   No medications on file         Cheri FowlerKayla Zaron Zwiefelhofer, PA-C 01/16/16 1432  Lavera Guise, MD 01/16/16 [phone]

## 2016-01-16 NOTE — ED Triage Notes (Signed)
Per EMS, pt came in as a result of an [facility] fall [facility].  Pt appears to have a 1inch laceration to the crown of her head.  Pt has a history of dementia & is a&o to person.  Denies dizziness, nausea, or shortness of breath.  BP: 110/60 HR: 82 RR: 18 98% RA

## 2016-01-16 NOTE — ED Notes (Signed)
[facility] called for transport.

## 2016-01-16 NOTE — ED Notes (Signed)
Bed: WA17 Expected date:  Expected time:  Means of arrival:  Comments: EMS head lac

## 2016-01-16 NOTE — ED Notes (Signed)
Neurological assessment is unremarkale.  Pt has a history of dementia and is slightly confused at baseline.

## 2016-01-20 ENCOUNTER — Emergency Department (HOSPITAL_COMMUNITY)
Admission: EM | Admit: 2016-01-20 | Discharge: 2016-01-20 | Disposition: A | Discharge status: Home / Self Care | Payer: Medicare FFS | Attending: Emergency Medicine | Admitting: Emergency Medicine

## 2016-01-20 ENCOUNTER — Encounter (HOSPITAL_COMMUNITY): Payer: Self-pay | Admitting: Emergency Medicine

## 2016-01-20 ENCOUNTER — Emergency Department (HOSPITAL_COMMUNITY): Payer: Medicare FFS

## 2016-01-20 DIAGNOSIS — F0390 Unspecified dementia without behavioral disturbance: Secondary | ICD-10-CM | POA: Diagnosis not present

## 2016-01-20 DIAGNOSIS — W19XXXA Unspecified fall, initial encounter: Secondary | ICD-10-CM | POA: Insufficient documentation

## 2016-01-20 DIAGNOSIS — Y939 Activity, unspecified: Secondary | ICD-10-CM | POA: Insufficient documentation

## 2016-01-20 DIAGNOSIS — M25551 Pain in right hip: Secondary | ICD-10-CM | POA: Insufficient documentation

## 2016-01-20 DIAGNOSIS — Y999 Unspecified external cause status: Secondary | ICD-10-CM | POA: Insufficient documentation

## 2016-01-20 DIAGNOSIS — Y929 Unspecified place or not applicable: Secondary | ICD-10-CM | POA: Diagnosis not present

## 2016-01-20 DIAGNOSIS — Z79899 Other long term (current) drug therapy: Secondary | ICD-10-CM | POA: Insufficient documentation

## 2016-01-20 NOTE — ED Notes (Signed)
Bed: [facility]D Expected date:  Expected time:  Means of arrival:  Comments: No bed  

## 2016-01-20 NOTE — ED Notes (Signed)
[facility] called for transportation  

## 2016-01-20 NOTE — ED Provider Notes (Signed)
[facility]-EMERGENCY DEPT Provider Note   CSN: [id number] Arrival date & time: 01/20/16  1505     History   Chief Complaint Chief Complaint  Patient presents with  . Fall  . Hip Pain    HPI Renee Kramer is a 82 y.o. female.  HPI  Patient is an 82 year old female with a history of Alzheimer's disease who was sent from her skilled nursing facility for right hip pain. They report that patient fell on October 19 and was evaluated here but the right hip was not imaged at that time. Patient has had right hip pain since her fall. On review of the note patient was noted to have her laceration that was closed. CT head and cervical spine were unremarkable. Patient was not noted to have right hip pain at that time.  Remainder of history, ROS, and physical exam limited due to patient's condition (Alzheimer's). Additional information was obtained from records.  Level V Caveat.    Past Medical History:  Diagnosis Date  . Alzheimer disease   . Dementia     There are no active problems to display for this patient.   History reviewed. No pertinent surgical history.  OB History    No data available       Home Medications    Prior to Admission medications   Medication Sig Start Date End Date Taking? Authorizing Provider  acetaminophen (TYLENOL) 325 MG tablet Take 325 mg by mouth 2 (two) times daily.    Yes Historical Provider, MD  alendronate (FOSAMAX) 35 MG tablet Take 35 mg by mouth every Thursday.    Yes Historical Provider, MD  calcium carbonate (OS-CAL - DOSED IN MG OF ELEMENTAL CALCIUM) 1250 (500 Ca) MG tablet Take 1 tablet by mouth 2 (two) times daily.   Yes Historical Provider, MD  cetirizine (ZYRTEC) 10 MG tablet Take 10 mg by mouth at bedtime.   Yes Historical Provider, MD  cholecalciferol (VITAMIN D) 1000 UNITS tablet Take 1,000 Units by mouth daily with breakfast.    Yes Historical Provider, MD  clobetasol ointment (TEMOVATE) 0.05 % Apply 1 application topically every 6  (six) hours as needed (rash).    Yes Historical Provider, MD  Cranberry 450 MG TABS Take 450 mg by mouth daily with breakfast.    Yes Historical Provider, MD  donepezil (ARICEPT) 10 MG tablet Take 10 mg by mouth at bedtime.   Yes Historical Provider, MD  ENSURE (ENSURE) Take 237 mLs by mouth 3 (three) times daily between meals.   Yes Historical Provider, MD  fluticasone (FLONASE) 50 MCG/ACT nasal spray Place 2 sprays into both nostrils daily.   Yes Historical Provider, MD  megestrol (MEGACE) 400 MG/10ML suspension Take 400 mg by mouth 2 (two) times daily.   Yes Historical Provider, MD  Melatonin 1 MG TABS Take 2 mg by mouth at bedtime.    Yes Historical Provider, MD  Multiple Vitamin (TAB-A-VITE) TABS Take 1 tablet by mouth daily with breakfast.   Yes Historical Provider, MD  senna (SENOKOT) 8.6 MG tablet Take 2 tablets by mouth every morning.    Yes Historical Provider, MD  sertraline (ZOLOFT) 50 MG tablet Take 50 mg by mouth every morning.   Yes Historical Provider, MD  ciprofloxacin (CIPRO) 250 MG tablet Take 250 mg by mouth 2 (two) times daily. Started 10/17 will take twice a day until 10/20    Historical Provider, MD  saccharomyces boulardii (FLORASTOR) 250 MG capsule Take 250 mg by mouth 2 (two) times daily. Started  on 10/13 will take twice a day until 10/22    Historical Provider, MD    Family History No family history on file.  Social History Social History  Substance Use Topics  . Smoking status: Never Smoker  . Smokeless tobacco: Never Used  . Alcohol use No     Allergies   Review of patient's allergies indicates no known allergies.   Review of Systems Review of Systems  Unable to perform ROS: Dementia     Physical Exam Updated Vital Signs BP 115/71   Pulse 91   Temp 98.5 F (36.9 C) (Oral)   Resp 21   SpO2 98%   Physical Exam  Constitutional: She is oriented to person, place, and time. She appears well-developed and well-nourished. No distress.  HENT:  Head:  Normocephalic and atraumatic.  Right Ear: External ear normal.  Left Ear: External ear normal.  Nose: Nose normal.  Eyes: Conjunctivae and EOM are normal. Pupils are equal, round, and reactive to light. Right eye exhibits no discharge. Left eye exhibits no discharge. No scleral icterus.  Neck: Normal range of motion. Neck supple.  Cardiovascular: Normal rate, regular rhythm and normal heart sounds.  Exam reveals no gallop and no friction rub.   No murmur heard. Pulses:      Radial pulses are 2+ on the right side, and 2+ on the left side.       Dorsalis pedis pulses are 2+ on the right side, and 2+ on the left side.  Pulmonary/Chest: Effort normal and breath sounds normal. No stridor. No respiratory distress. She has no wheezes.  Abdominal: Soft. She exhibits no distension. There is no tenderness.  Musculoskeletal: She exhibits no edema.       Right hip: She exhibits tenderness. She exhibits normal range of motion.       Cervical back: She exhibits no bony tenderness.       Thoracic back: She exhibits no bony tenderness.       Lumbar back: She exhibits no bony tenderness.  Clavicles stable. Chest stable to AP/Lat compression. Pelvis stable to Lat compression. No obvious extremity deformity. No seat belt sign.  Neurological: She is alert and oriented to person, place, and time.  Moving all extremities  Skin: Skin is warm and dry. No rash noted. She is not diaphoretic. No erythema.  Psychiatric: She has a normal mood and affect.     ED Treatments / Results  Labs (all labs ordered are listed, but only abnormal results are displayed) Labs Reviewed - No data to display  EKG  EKG Interpretation None       Radiology Dg Lumbar Spine 2-3 Views  Result Date: 01/20/2016 CLINICAL DATA:  Right hip pain after falling 4 days ago. Unable to bear weight. EXAM: LUMBAR SPINE - 2-3 VIEW COMPARISON:  None. FINDINGS: The bones are demineralized. There are 5 lumbar type vertebral bodies. There  is 4 mm of anterolisthesis at L4-5. The alignment is otherwise normal. No evidence of acute fracture or pars defect. Disc space narrowing is present at L3-4 and L4-5. There are facet degenerative changes inferiorly. Mild aortic and branch vessel atherosclerosis. IMPRESSION: No acute osseous findings. Lower lumbar spondylosis with degenerative anterolisthesis at L4-5. Electronically Signed   By: Carey Bullocks M.D.   On: 01/20/2016 17:19   Dg Hip Unilat W Or Wo Pelvis 2-3 Views Right  Result Date: 01/20/2016 CLINICAL DATA:  Right hip pain after a fall, initial encounter. Unable to bear weight. EXAM: DG HIP (WITH  OR WITHOUT PELVIS) 2-3V RIGHT COMPARISON:  12/14/2015. FINDINGS: No acute osseous or joint abnormality. No degenerative changes in the right hip. Degenerative changes are seen the visualized lumbosacral spine. IMPRESSION: No fracture. Electronically Signed   By: Leanna BattlesMelinda  Blietz M.D.   On: 01/20/2016 17:18    Procedures Procedures (including critical care time)  Medications Ordered in ED Medications - No data to display   Initial Impression / Assessment and Plan / ED Course  I have reviewed the triage vital signs and the nursing notes.  Pertinent labs & imaging results that were available during my care of the patient were reviewed by me and considered in my medical decision making (see chart for details).  Clinical Course    Obtained a plain film of the pelvis/right hip as well as the lumbar spine which were negative for any acute injuries. My exam there was no evidence of other acute injuries.  Safe for discharge with strict return precautions.  Final Clinical Impressions(s) / ED Diagnoses   Final diagnoses:  Right hip pain   Disposition: Discharge  Condition: Good    Current Discharge Medication List      Follow Up: Primary care provider  Schedule an appointment as soon as possible for a visit  As needed      Nira ConnPedro Eduardo Cardama, MD 01/20/16 1734

## 2016-01-20 NOTE — ED Notes (Signed)
Bed: [facility]02 Expected date:  Expected time:  Means of arrival:  Comments: 

## 2016-01-20 NOTE — ED Notes (Signed)
Attempted to call pts facility with no response. Discharge instructions given to [facility] and sent with pt.

## 2016-01-20 NOTE — ED Triage Notes (Signed)
[facility] complaint of R hip pain post fall Thursday. Pt seen here for same fall but no evaluation of R hip related to NO complaint at that time. Pt currently unable to bear wt to R leg related to pain. No shortening/rotation noted to R hip/leg; right pedal pulse present. Pt hx dementia; alert to self ONLY.

## 2016-01-20 NOTE — ED Notes (Signed)
PT DISCHARGED. INSTRUCTIONS GIVEN TO PTAR STAFF. PT IN NO APPARENT DISTRESS OR PAIN. THE OPPORTUNITY TO ASK QUESTIONS WAS PROVIDED. 

## 2016-04-10 ENCOUNTER — Encounter (HOSPITAL_COMMUNITY): Payer: Self-pay | Admitting: Emergency Medicine

## 2016-04-10 ENCOUNTER — Emergency Department (HOSPITAL_COMMUNITY)
Admission: EM | Admit: 2016-04-10 | Discharge: 2016-04-11 | Disposition: A | Discharge status: Home / Self Care | Payer: Medicare FFS | Attending: Emergency Medicine | Admitting: Emergency Medicine

## 2016-04-10 DIAGNOSIS — R52 Pain, unspecified: Secondary | ICD-10-CM | POA: Diagnosis present

## 2016-04-10 DIAGNOSIS — R531 Weakness: Secondary | ICD-10-CM | POA: Insufficient documentation

## 2016-04-10 DIAGNOSIS — G309 Alzheimer's disease, unspecified: Secondary | ICD-10-CM | POA: Diagnosis not present

## 2016-04-10 LAB — I-STAT CHEM 8, ED
BUN: 32 mg/dL — AB (ref 6–20)
CALCIUM ION: 1.19 mmol/L (ref 1.15–1.40)
CREATININE: 1 mg/dL (ref 0.44–1.00)
Chloride: 101 mmol/L (ref 101–111)
GLUCOSE: 141 mg/dL — AB (ref 65–99)
HCT: 36 % (ref 36.0–46.0)
Hemoglobin: 12.2 g/dL (ref 12.0–15.0)
POTASSIUM: 4.9 mmol/L (ref 3.5–5.1)
Sodium: 132 mmol/L — ABNORMAL LOW (ref 135–145)
TCO2: 22 mmol/L (ref 0–100)

## 2016-04-10 LAB — CBC
HEMATOCRIT: 35.5 % — AB (ref 36.0–46.0)
Hemoglobin: 12.1 g/dL (ref 12.0–15.0)
MCH: 31 pg (ref 26.0–34.0)
MCHC: 34.1 g/dL (ref 30.0–36.0)
MCV: 91 fL (ref 78.0–100.0)
PLATELETS: 273 10*3/uL (ref 150–400)
RBC: 3.9 MIL/uL (ref 3.87–5.11)
RDW: 14.3 % (ref 11.5–15.5)
WBC: 16.1 10*3/uL — AB (ref 4.0–10.5)

## 2016-04-10 NOTE — ED Notes (Signed)
Bed: [facility] Expected date:  Expected time:  Means of arrival:  Comments: No bed 

## 2016-04-10 NOTE — ED Triage Notes (Signed)
[facility] with c/o generalized pain.  Pt is a very poor historian--- has dementia.  Pt reports pain to chest but during the course of assessment, pt c/o leg pain.  Pt arrived to ED denying pain; pt stated, I'm just cold".

## 2016-04-11 ENCOUNTER — Emergency Department (HOSPITAL_COMMUNITY): Payer: Medicare FFS

## 2016-04-11 DIAGNOSIS — R531 Weakness: Secondary | ICD-10-CM | POA: Diagnosis not present

## 2016-04-11 LAB — URINALYSIS, ROUTINE W REFLEX MICROSCOPIC
Bilirubin Urine: NEGATIVE
GLUCOSE, UA: NEGATIVE mg/dL
HGB URINE DIPSTICK: NEGATIVE
Ketones, ur: NEGATIVE mg/dL
Leukocytes, UA: NEGATIVE
Nitrite: NEGATIVE
PROTEIN: NEGATIVE mg/dL
Specific Gravity, Urine: 1.012 (ref 1.005–1.030)
pH: 6 (ref 5.0–8.0)

## 2016-04-11 LAB — INFLUENZA PANEL BY PCR (TYPE A & B)
INFLBPCR: NEGATIVE
Influenza A By PCR: NEGATIVE

## 2016-04-11 NOTE — Discharge Instructions (Signed)
No acute abnormalities were found during her evaluation in the emergency room today.  Return to ER, or see primary care physician with any additional, or change in symptoms.

## 2016-04-11 NOTE — ED Provider Notes (Addendum)
[facility]-EMERGENCY DEPT Provider Note   CSN: [id number] Arrival date & time: 04/10/16  2040     History   Chief Complaint Chief Complaint  Patient presents with  . Generalized Pain    HPI Renee Kramer is a 83 y.o. female.  HPI Pt comes in with cc of generalized weakness. PT has hx of dementia and not able to provide very good hx. Per nursing report, pt was having leg pain and so she called EMS, however, over here pt has only c/o weakness.  Pt denies nausea, emesis, fevers, chills, chest pains, shortness of breath, headaches, abdominal pain, uti like symptoms to me.    Past Medical History:  Diagnosis Date  . Alzheimer disease   . Dementia     There are no active problems to display for this patient.   History reviewed. No pertinent surgical history.  OB History    No data available       Home Medications    Prior to Admission medications   Medication Sig Start Date End Date Taking? Authorizing Provider  acetaminophen (TYLENOL) 325 MG tablet Take 325 mg by mouth 2 (two) times daily.    Yes Historical Provider, MD  alendronate (FOSAMAX) 35 MG tablet Take 35 mg by mouth every Thursday.    Yes Historical Provider, MD  calcium carbonate (OS-CAL - DOSED IN MG OF ELEMENTAL CALCIUM) 1250 (500 Ca) MG tablet Take 1 tablet by mouth 2 (two) times daily.   Yes Historical Provider, MD  cetirizine (ZYRTEC) 10 MG tablet Take 10 mg by mouth at bedtime.   Yes Historical Provider, MD  cholecalciferol (VITAMIN D) 1000 UNITS tablet Take 1,000 Units by mouth daily with breakfast.    Yes Historical Provider, MD  clobetasol ointment (TEMOVATE) 0.05 % Apply 1 application topically every 6 (six) hours as needed (rash).    Yes Historical Provider, MD  Cranberry 450 MG TABS Take 450 mg by mouth daily with breakfast.    Yes Historical Provider, MD  donepezil (ARICEPT) 10 MG tablet Take 10 mg by mouth at bedtime.   Yes Historical Provider, MD  ENSURE (ENSURE) Take 237 mLs by mouth 3 (three) times  daily with meals.    Yes Historical Provider, MD  fluticasone (FLONASE) 50 MCG/ACT nasal spray Place 2 sprays into both nostrils daily.   Yes Historical Provider, MD  lisinopril (PRINIVIL,ZESTRIL) 5 MG tablet Take 5 mg by mouth every morning.   Yes Historical Provider, MD  megestrol (MEGACE) 400 MG/10ML suspension Take 400 mg by mouth 2 (two) times daily.   Yes Historical Provider, MD  Melatonin 1 MG TABS Take 2 mg by mouth at bedtime.    Yes Historical Provider, MD  Multiple Vitamin (TAB-A-VITE) TABS Take 1 tablet by mouth daily with breakfast.   Yes Historical Provider, MD  senna (SENOKOT) 8.6 MG tablet Take 2 tablets by mouth every morning.    Yes Historical Provider, MD  sertraline (ZOLOFT) 50 MG tablet Take 50 mg by mouth every morning.   Yes Historical Provider, MD    Family History History reviewed. No pertinent family history.  Social History Social History  Substance Use Topics  . Smoking status: Never Smoker  . Smokeless tobacco: Never Used  . Alcohol use No     Allergies   Patient has no known allergies.   Review of Systems Review of Systems  Unable to perform ROS: Dementia     Physical Exam Updated Vital Signs BP 114/71 (BP Location: Right Arm)   Pulse  76   Temp 98.1 F (36.7 C) (Oral)   Resp 18   SpO2 94%   Physical Exam  Constitutional: She is oriented to person, place, and time. She appears well-developed.  HENT:  Head: Normocephalic and atraumatic.  Eyes: EOM are normal.  Neck: Normal range of motion. Neck supple.  Cardiovascular: Normal rate.   Pulmonary/Chest: Effort normal.  Abdominal: Bowel sounds are normal.  Musculoskeletal:  Head to toe evaluation shows no hematoma, bleeding of the scalp, no facial abrasions, no spine step offs, crepitus of the chest or neck, no tenderness to palpation of the bilateral upper and lower extremities, no gross deformities, no chest tenderness, no pelvic pain.   Neurological: She is alert and oriented to person,  place, and time.  Skin: Skin is warm and dry.  Nursing note and vitals reviewed.    ED Treatments / Results  Labs (all labs ordered are listed, but only abnormal results are displayed) Labs Reviewed  CBC - Abnormal; Notable for the following:       Result Value   WBC 16.1 (*)    HCT 35.5 (*)    All other components within normal limits  I-STAT CHEM 8, ED - Abnormal; Notable for the following:    Sodium 132 (*)    BUN 32 (*)    Glucose, Bld 141 (*)    All other components within normal limits  INFLUENZA PANEL BY PCR (TYPE A & B, H1N1)  URINALYSIS, ROUTINE W REFLEX MICROSCOPIC    EKG  EKG Interpretation None       Radiology No results found.  Procedures Procedures (including critical care time)  Medications Ordered in ED Medications - No data to display   Initial Impression / Assessment and Plan / ED Course  I have reviewed the triage vital signs and the nursing notes.  Pertinent labs & imaging results that were available during my care of the patient were reviewed by me and considered in my medical decision making (see chart for details).  Clinical Course    Pt comes in with cc of generalized weakness. Pt on exam has no focal findings. Pt's WC is elevated at 16K. Pt is not immunosuppressed. We have ordered basic labs and influenza along with UA and CXR. Anticipate d/c. Signing out care to Dr. Jodi MourningZavitz at 1:45 am.   Final Clinical Impressions(s) / ED Diagnoses   Final diagnoses:  Generalized weakness    New Prescriptions New Prescriptions   No medications on file     Derwood KaplanAnkit Issis Lindseth, MD 04/11/16 0127    Derwood KaplanAnkit Carden Teel, MD 04/11/16 [id number]    Derwood KaplanAnkit Skarleth Delmonico, MD 04/11/16 [id number]

## 2016-04-11 NOTE — ED Notes (Signed)
Pt tranported by [facility] to [facility].

## 2016-04-11 NOTE — ED Notes (Signed)
Pt able to take 3 sips of water w/o issues, states "that's good, but I don't want anymore now." Pt still has no complaints of being unable to hold down water. RN advised. ENM

## 2016-04-11 NOTE — ED Notes (Signed)
Called lab re: flu panel. Has not been completed, lab to complete asap.

## 2016-05-04 ENCOUNTER — Emergency Department (HOSPITAL_COMMUNITY): Payer: Medicare FFS

## 2016-05-04 ENCOUNTER — Emergency Department (HOSPITAL_COMMUNITY)
Admission: EM | Admit: 2016-05-04 | Discharge: 2016-05-05 | Disposition: A | Discharge status: Home / Self Care | Payer: Medicare FFS | Attending: Emergency Medicine | Admitting: Emergency Medicine

## 2016-05-04 ENCOUNTER — Encounter (HOSPITAL_COMMUNITY): Payer: Self-pay | Admitting: Emergency Medicine

## 2016-05-04 DIAGNOSIS — J189 Pneumonia, unspecified organism: Secondary | ICD-10-CM | POA: Insufficient documentation

## 2016-05-04 DIAGNOSIS — Y939 Activity, unspecified: Secondary | ICD-10-CM | POA: Diagnosis not present

## 2016-05-04 DIAGNOSIS — G309 Alzheimer's disease, unspecified: Secondary | ICD-10-CM | POA: Diagnosis not present

## 2016-05-04 DIAGNOSIS — Y929 Unspecified place or not applicable: Secondary | ICD-10-CM | POA: Insufficient documentation

## 2016-05-04 DIAGNOSIS — M25562 Pain in left knee: Secondary | ICD-10-CM | POA: Insufficient documentation

## 2016-05-04 DIAGNOSIS — W19XXXA Unspecified fall, initial encounter: Secondary | ICD-10-CM | POA: Diagnosis not present

## 2016-05-04 DIAGNOSIS — N3000 Acute cystitis without hematuria: Secondary | ICD-10-CM | POA: Insufficient documentation

## 2016-05-04 DIAGNOSIS — R05 Cough: Secondary | ICD-10-CM | POA: Diagnosis present

## 2016-05-04 DIAGNOSIS — Y999 Unspecified external cause status: Secondary | ICD-10-CM | POA: Diagnosis not present

## 2016-05-04 LAB — CBC WITH DIFFERENTIAL/PLATELET
BASOS PCT: 0 %
Basophils Absolute: 0 10*3/uL (ref 0.0–0.1)
EOS ABS: 0.1 10*3/uL (ref 0.0–0.7)
Eosinophils Relative: 1 %
HEMATOCRIT: 31.6 % — AB (ref 36.0–46.0)
HEMOGLOBIN: 10.9 g/dL — AB (ref 12.0–15.0)
LYMPHS ABS: 1.4 10*3/uL (ref 0.7–4.0)
Lymphocytes Relative: 16 %
MCH: 31.6 pg (ref 26.0–34.0)
MCHC: 34.5 g/dL (ref 30.0–36.0)
MCV: 91.6 fL (ref 78.0–100.0)
MONO ABS: 0.5 10*3/uL (ref 0.1–1.0)
MONOS PCT: 6 %
NEUTROS PCT: 77 %
Neutro Abs: 6.7 10*3/uL (ref 1.7–7.7)
Platelets: 282 10*3/uL (ref 150–400)
RBC: 3.45 MIL/uL — ABNORMAL LOW (ref 3.87–5.11)
RDW: 14.8 % (ref 11.5–15.5)
WBC: 8.7 10*3/uL (ref 4.0–10.5)

## 2016-05-04 LAB — URINALYSIS, ROUTINE W REFLEX MICROSCOPIC
Bilirubin Urine: NEGATIVE
Glucose, UA: NEGATIVE mg/dL
Hgb urine dipstick: NEGATIVE
KETONES UR: NEGATIVE mg/dL
Nitrite: POSITIVE — AB
Protein, ur: NEGATIVE mg/dL
Specific Gravity, Urine: 1.016 (ref 1.005–1.030)
pH: 6 (ref 5.0–8.0)

## 2016-05-04 LAB — BASIC METABOLIC PANEL
Anion gap: 7 (ref 5–15)
BUN: 23 mg/dL — ABNORMAL HIGH (ref 6–20)
CALCIUM: 9.6 mg/dL (ref 8.9–10.3)
CHLORIDE: 103 mmol/L (ref 101–111)
CO2: 23 mmol/L (ref 22–32)
CREATININE: 1.07 mg/dL — AB (ref 0.44–1.00)
GFR calc non Af Amer: 46 mL/min — ABNORMAL LOW (ref >60)
GFR, EST AFRICAN AMERICAN: 53 mL/min — AB (ref >60)
Glucose, Bld: 96 mg/dL (ref 65–99)
Potassium: 4.3 mmol/L (ref 3.5–5.1)
SODIUM: 133 mmol/L — AB (ref 135–145)

## 2016-05-04 LAB — I-STAT CG4 LACTIC ACID, ED
LACTIC ACID, VENOUS: 0.96 mmol/L (ref 0.5–1.9)
Lactic Acid, Venous: 1.38 mmol/L (ref 0.5–1.9)

## 2016-05-04 MED ORDER — LEVOFLOXACIN 750 MG PO TABS
750.0000 mg | ORAL_TABLET | Freq: Once | ORAL | Status: AC
  Administered 2016-05-04: 750 mg via ORAL
  Filled 2016-05-04: qty 1

## 2016-05-04 MED ORDER — LEVOFLOXACIN 750 MG PO TABS: 750.0000 mg | ORAL_TABLET | Freq: Every day | ORAL | 0 refills | Status: AC

## 2016-05-04 NOTE — ED Notes (Signed)
1 IV start attempt 

## 2016-05-04 NOTE — ED Notes (Signed)
[facility] Expected date:  Expected time:  Means of arrival:  Comments: Fall dementia, rm 11

## 2016-05-04 NOTE — Discharge Instructions (Signed)
We did not find any injuries from your fall. He had no traumatic injuries to her head or neck. Your knee x-ray showed no fracture or malalignment. We did find evidence of small pneumonia and small urinary tract infection. He will be given antibiotics for the next 4 days. You received 1 dose of antibiotics in the emergency department. If any symptoms return or worsen, please return to the nearest emergency department. Please follow-up with a primary doctor for further management.

## 2016-05-04 NOTE — ED Notes (Signed)
Pt transported to CT/XRay 

## 2016-05-04 NOTE — ED Notes (Signed)
Phone report called to [employer] [employer]. Discharge instructions and prescriptions reviewed.

## 2016-05-04 NOTE — ED Notes (Signed)
[facility] contacted for transportation back to [facility].

## 2016-05-04 NOTE — ED Provider Notes (Signed)
[facility]-EMERGENCY DEPT Provider Note   CSN: [id number] Arrival date & time: 05/04/16  1602     History   Chief Complaint Chief Complaint  Patient presents with  . Fall    HPI Renee Kramer is a 83 y.o. female.  The history is provided by the patient and medical records. The history is limited by the condition of the patient. No language interpreter was used.  Fall  This is a recurrent problem. The current episode started 1 to 2 hours ago. The problem occurs constantly. The problem has not changed since onset.Pertinent negatives include no chest pain, no abdominal pain, no headaches and no shortness of breath. Nothing aggravates the symptoms. Nothing relieves the symptoms. She has tried nothing for the symptoms. The treatment provided no relief.    Past Medical History:  Diagnosis Date  . Alzheimer disease   . Dementia     There are no active problems to display for this patient.   History reviewed. No pertinent surgical history.  OB History    No data available       Home Medications    Prior to Admission medications   Medication Sig Start Date End Date Taking? Authorizing Provider  acetaminophen (TYLENOL) 325 MG tablet Take 325 mg by mouth 2 (two) times daily.    Yes Historical Provider, MD  acetaminophen (TYLENOL) 500 MG tablet Take 500 mg by mouth every 4 (four) hours as needed for moderate pain, fever or headache.   Yes Historical Provider, MD  alendronate (FOSAMAX) 35 MG tablet Take 35 mg by mouth every Thursday.    Yes Historical Provider, MD  alum & mag hydroxide-simeth (MINTOX) 200-200-20 MG/5ML suspension Take 30 mLs by mouth every 6 (six) hours as needed for indigestion or heartburn.   Yes Historical Provider, MD  calcium carbonate (OS-CAL - DOSED IN MG OF ELEMENTAL CALCIUM) 1250 (500 Ca) MG tablet Take 1 tablet by mouth 2 (two) times daily.   Yes Historical Provider, MD  cetirizine (ZYRTEC) 10 MG tablet Take 10 mg by mouth at bedtime.   Yes Historical Provider,  MD  cholecalciferol (VITAMIN D) 1000 UNITS tablet Take 1,000 Units by mouth daily with breakfast.    Yes Historical Provider, MD  clobetasol ointment (TEMOVATE) 0.05 % Apply 1 application topically every 6 (six) hours as needed (rash).    Yes Historical Provider, MD  Cranberry 450 MG TABS Take 450 mg by mouth daily with breakfast.    Yes Historical Provider, MD  donepezil (ARICEPT) 10 MG tablet Take 10 mg by mouth at bedtime.   Yes Historical Provider, MD  fluticasone (FLONASE) 50 MCG/ACT nasal spray Place 2 sprays into both nostrils daily.   Yes Historical Provider, MD  guaifenesin (ROBAFEN) 100 MG/5ML syrup Take 200 mg by mouth every 6 (six) hours as needed for cough.   Yes Historical Provider, MD  lisinopril (PRINIVIL,ZESTRIL) 5 MG tablet Take 5 mg by mouth every morning.   Yes Historical Provider, MD  loperamide (IMODIUM) 2 MG capsule Take 2 mg by mouth as needed for diarrhea or loose stools.   Yes Historical Provider, MD  magnesium hydroxide (MILK OF MAGNESIA) 400 MG/5ML suspension Take 30 mLs by mouth at bedtime as needed for mild constipation.   Yes Historical Provider, MD  megestrol (MEGACE) 400 MG/10ML suspension Take 400 mg by mouth 2 (two) times daily.   Yes Historical Provider, MD  Melatonin 1 MG TABS Take 2 mg by mouth at bedtime.    Yes Historical Provider, MD  Multiple Vitamin (TAB-A-VITE) TABS Take 1 tablet by mouth daily with breakfast.   Yes Historical Provider, MD  Neomycin-Bacitracin-Polymyxin (TRIPLE ANTIBIOTIC) [phone] OINT Apply 1 application topically as needed.   Yes Historical Provider, MD  PRESCRIPTION MEDICATION Take 118 mLs by mouth 3 (three) times daily. Mighty shakes   Yes Historical Provider, MD  senna (SENOKOT) 8.6 MG tablet Take 2 tablets by mouth every morning.    Yes Historical Provider, MD  sertraline (ZOLOFT) 50 MG tablet Take 50 mg by mouth every morning.   Yes Historical Provider, MD    Family History History reviewed. No pertinent family  history.  Social History Social History  Substance Use Topics  . Smoking status: Never Smoker  . Smokeless tobacco: Never Used  . Alcohol use No     Allergies   Patient has no known allergies.   Review of Systems Review of Systems  Constitutional: Negative for activity change, chills, diaphoresis, fatigue and fever.  HENT: Negative for congestion and rhinorrhea.   Eyes: Negative for visual disturbance.  Respiratory: Positive for cough. Negative for chest tightness, shortness of breath and stridor.   Cardiovascular: Negative for chest pain, palpitations and leg swelling.  Gastrointestinal: Negative for abdominal distention, abdominal pain, constipation, diarrhea, nausea and vomiting.  Genitourinary: Positive for dysuria. Negative for difficulty urinating, flank pain, frequency, hematuria, menstrual problem, pelvic pain, vaginal bleeding and vaginal discharge.  Musculoskeletal: Negative for back pain and neck pain.  Skin: Negative for rash and wound.  Neurological: Negative for dizziness, weakness, light-headedness, numbness and headaches.  Psychiatric/Behavioral: Negative for agitation and confusion.  All other systems reviewed and are negative.    Physical Exam Updated Vital Signs BP 118/82 (BP Location: Right Arm)   Pulse 91   Temp 98.3 F (36.8 C) (Oral)   Resp 20   SpO2 100%   Physical Exam  Constitutional: She is oriented to person, place, and time. She appears well-developed and well-nourished. No distress.  HENT:  Head: Normocephalic and atraumatic.  Right Ear: External ear normal.  Left Ear: External ear normal.  Nose: Nose normal.  Mouth/Throat: Oropharynx is clear and moist. No oropharyngeal exudate.  No evidence of traumatic injury to head. No neck tenderness. Full range of motion of neck without pain.  Eyes: Conjunctivae and EOM are normal. Pupils are equal, round, and reactive to light.  Neck: Normal range of motion. Neck supple.  Cardiovascular: Normal  rate, regular rhythm, normal heart sounds and intact distal pulses.   No murmur heard. Pulmonary/Chest: Breath sounds normal. No stridor. No respiratory distress. She has no wheezes. She has no rales. She exhibits no tenderness.  Abdominal: She exhibits no distension. There is no tenderness. There is no rebound and no guarding.  Musculoskeletal: She exhibits tenderness.       Left knee: She exhibits normal range of motion, no swelling, no effusion, no ecchymosis, no deformity, no laceration, no erythema, normal patellar mobility and no bony tenderness. Tenderness found.       Hands: Neurological: She is alert and oriented to person, place, and time. She has normal reflexes. She exhibits normal muscle tone. Coordination normal.  Skin: Skin is warm. Capillary refill takes less than 2 seconds. No rash noted. She is not diaphoretic. No erythema.  Psychiatric: She has a normal mood and affect.  Nursing note and vitals reviewed.    ED Treatments / Results  Labs (all labs ordered are listed, but only abnormal results are displayed) Labs Reviewed  CBC WITH DIFFERENTIAL/PLATELET - Abnormal; Notable  for the following:       Result Value   RBC 3.45 (*)    Hemoglobin 10.9 (*)    HCT 31.6 (*)    All other components within normal limits  BASIC METABOLIC PANEL - Abnormal; Notable for the following:    Sodium 133 (*)    BUN 23 (*)    Creatinine, Ser 1.07 (*)    GFR calc non Af Amer 46 (*)    GFR calc Af Amer 53 (*)    All other components within normal limits  URINALYSIS, ROUTINE W REFLEX MICROSCOPIC - Abnormal; Notable for the following:    APPearance HAZY (*)    Nitrite POSITIVE (*)    Leukocytes, UA LARGE (*)    Bacteria, UA MANY (*)    Squamous Epithelial / LPF 0-5 (*)    Non Squamous Epithelial 0-5 (*)    All other components within normal limits  URINE CULTURE  I-STAT CG4 LACTIC ACID, ED  I-STAT CG4 LACTIC ACID, ED    EKG  EKG Interpretation None       Radiology Dg Chest 2  View  Result Date: 05/04/2016 CLINICAL DATA:  Fall EXAM: CHEST  2 VIEW COMPARISON:  Chest radiograph 04/11/2016 FINDINGS: Unchanged mediastinal contours. There bibasilar opacities. No pleural effusion or pneumothorax. No overt pulmonary edema. Shallow lung inflation. IMPRESSION: Bibasilar opacities may indicate atelectasis or developing infection. Electronically Signed   By: Deatra Robinson M.D.   On: 05/04/2016 17:41   Ct Head Wo Contrast  Result Date: 05/04/2016 CLINICAL DATA:  Pt is from [facility]. Unwitnessed fall in doorway to her room. Landed on left side onto hardwood floor. Pt has Hx of dementia but does remember fall. Pt states that she just slipped. Pt denies Pain and denies LOC. No blood thinners. EXAM: CT HEAD WITHOUT CONTRAST CT CERVICAL SPINE WITHOUT CONTRAST TECHNIQUE: Multidetector CT imaging of the head and cervical spine was performed following the standard protocol without intravenous contrast. Multiplanar CT image reconstructions of the cervical spine were also generated. COMPARISON:  CT head and neck 01/16/2016, CT chest 10/05/2014 FINDINGS: CT HEAD FINDINGS Brain: No evidence of acute infarction, hemorrhage, extra-axial collection, ventriculomegaly, or mass effect. Generalized cerebral atrophy. Periventricular white matter low attenuation likely secondary to microangiopathy. Vascular: Cerebrovascular atherosclerotic calcifications are noted. Skull: Negative for fracture or focal lesion. Sinuses/Orbits: Visualized portions of the orbits are unremarkable. Mastoid sinuses are clear. Right sphenoid sinus mucosal thickening and air-fluid level. Other: None. CT CERVICAL SPINE FINDINGS Alignment: 2 mm anterolisthesis of C7 on T1 secondary to facet arthropathy. Skull base and vertebrae: No acute fracture. No primary bone lesion or focal pathologic process. Soft tissues and spinal canal: No prevertebral fluid or swelling. No visible canal hematoma. 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 Degenerative disc disease at C5-6 with disc height loss with a broad-based disc osteophyte complex, right uncovertebral degenerative changes and right facet arthropathy resulting in severe right foraminal stenosis. Degenerative disc disease with disc height loss at C6-7. Moderate bilateral facet arthropathy at C7-T1. Upper chest: Mild nodular left apical scarring unchanged compared with 10/05/2014. Other: Minimal bilateral carotid artery atherosclerosis. IMPRESSION: 1. No acute intracranial pathology. 2. No acute osseous injury of the cervical spine. Electronically Signed   By: Elige Ko   On: 05/04/2016 17:56   Ct Cervical Spine Wo Contrast  Result Date: 05/04/2016 CLINICAL DATA:  Pt is from [facility]. Unwitnessed fall in doorway to her room. Landed on left side  onto hardwood floor. Pt has Hx of dementia but does remember fall. Pt states that she just slipped. Pt denies Pain and denies LOC. No blood thinners. EXAM: CT HEAD WITHOUT CONTRAST CT CERVICAL SPINE WITHOUT CONTRAST TECHNIQUE: Multidetector CT imaging of the head and cervical spine was performed following the standard protocol without intravenous contrast. Multiplanar CT image reconstructions of the cervical spine were also generated. COMPARISON:  CT head and neck 01/16/2016, CT chest 10/05/2014 FINDINGS: CT HEAD FINDINGS Brain: No evidence of acute infarction, hemorrhage, extra-axial collection, ventriculomegaly, or mass effect. Generalized cerebral atrophy. Periventricular white matter low attenuation likely secondary to microangiopathy. Vascular: Cerebrovascular atherosclerotic calcifications are noted. Skull: Negative for fracture or focal lesion. Sinuses/Orbits: Visualized portions of the orbits are unremarkable. Mastoid sinuses are clear. Right sphenoid sinus mucosal thickening and air-fluid level. Other: None. CT CERVICAL SPINE FINDINGS Alignment: 2 mm anterolisthesis of C7 on T1 secondary to facet  arthropathy. Skull base and vertebrae: No acute fracture. No primary bone lesion or focal pathologic process. Soft tissues and spinal canal: No prevertebral fluid or swelling. No visible canal hematoma. 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 Degenerative disc disease at C5-6 with disc height loss with a broad-based disc osteophyte complex, right uncovertebral degenerative changes and right facet arthropathy resulting in severe right foraminal stenosis. Degenerative disc disease with disc height loss at C6-7. Moderate bilateral facet arthropathy at C7-T1. Upper chest: Mild nodular left apical scarring unchanged compared with 10/05/2014. Other: Minimal bilateral carotid artery atherosclerosis. IMPRESSION: 1. No acute intracranial pathology. 2. No acute osseous injury of the cervical spine. Electronically Signed   By: Elige Ko   On: 05/04/2016 17:56   Dg Knee Complete 4 Views Left  Result Date: 05/04/2016 CLINICAL DATA:  Unwitnessed fall in door way to room, dementia, anterior LEFT knee pain EXAM: LEFT KNEE - COMPLETE 4+ VIEW COMPARISON:  12/06/2015 FINDINGS: Osseous demineralization. Joint spaces preserved. No acute fracture, dislocation or bone destruction. No joint effusion. IMPRESSION: No acute bony abnormalities. Electronically Signed   By: Ulyses Southward M.D.   On: 05/04/2016 17:35    Procedures Procedures (including critical care time)  Medications Ordered in ED Medications  levofloxacin (LEVAQUIN) tablet 750 mg (750 mg Oral Given 05/04/16 2325)     Initial Impression / Assessment and Plan / ED Course  I have reviewed the triage vital signs and the nursing notes.  Pertinent labs & imaging results that were available during my care of the patient were reviewed by me and considered in my medical decision making (see chart for details).     Renee Kramer is a 83 y.o. female With the past medical history significant for dementia and multiple falls who  presents with a fall and mild complaint of nonproductive cough and dysuria. Patient denies any fevers, chills, chest pain, shortness of breath, nausea, vomiting, abdominal pain, constipation, or diarrhea. Patient is not oriented to place or time. According to EMS, patient had a fall at her facility today. Patient denies hitting her head but is unsure of all details of her fall. She denies any preceding palpitations or chest pain. Patient does report nonproductive cough and some dysuria.  History and exam seen above.  Patients exam was grossly unremarkable aside from mild tenderness on her left knee. Full range of motion present, normal pulses, sensation, and Strength in the leg. Small old abrasion found on left hand. No evidence of head trauma.  Given fall, patient had head CT, cervical spine imaging, chest  x-ray, and knee x-ray. No evidence of acute fractures or malalignment. Chest x-ray shows concern for possible pneumonia.  Given history of falls, workup for occult infection was also performed. Aside from pneumonia, patient also found to have UTI with positive nitrites.  As patient has no current complaints and feels well, patient will be treated for pneumonia and UTI with Levaquin. Patient given a dose in the emergency department and tolerated it without difficulty.   Patient will follow up with PCP for rechecking further management. Patient has unremarkable vital signs and appears well. Patient appear stable for discharge back to facility for outpatient management of immunity acquired pneumonia and urinary tract infection. Strict return precautions were given for any new or worsening symptoms. Patient discharged back to facility without further complication or other problems.   Final Clinical Impressions(s) / ED Diagnoses   Final diagnoses:  Acute cystitis without hematuria  Community acquired pneumonia, unspecified laterality    New Prescriptions Discharge Medication List as of 05/04/2016  11:13 PM    START taking these medications   Details  levofloxacin (LEVAQUIN) 750 MG tablet Take 1 tablet (750 mg total) by mouth daily., Starting Tue 05/05/2016, Until Sat 05/09/2016, Print        Clinical Impression: 1. Acute cystitis without hematuria   2. Community acquired pneumonia, unspecified laterality     Disposition: Discharge  Condition: Good  I have discussed the results, Dx and Tx plan with the pt(& family if present). He/she/they expressed understanding and agree(s) with the plan. Discharge instructions discussed at great length. Strict return precautions discussed and pt &/or family have verbalized understanding of the instructions. No further questions at time of discharge.    Discharge Medication List as of 05/04/2016 11:13 PM    START taking these medications   Details  levofloxacin (LEVAQUIN) 750 MG tablet Take 1 tablet (750 mg total) by mouth daily., Starting Tue 05/05/2016, Until Sat 05/09/2016, Print        Follow Up: [facility] [address] [id number] [place] [address] [phone]  If symptoms worsen  [facility] [address] [place] [address] [phone]       Heide Scales, MD 05/05/16 [phone]

## 2016-05-04 NOTE — ED Triage Notes (Signed)
[facility]  Unwitnessed fall in doorway to her room.  Landed on left side onto hardwood floor.  Pt has Hx of dementia but does remember fall.  Pt states that she just slipped.  Pt denies Pain and denies LOC.  No blood thinners Rx.

## 2016-05-07 LAB — URINE CULTURE: Culture: >=100000 — AB

## 2016-05-08 ENCOUNTER — Telehealth (HOSPITAL_BASED_OUTPATIENT_CLINIC_OR_DEPARTMENT_OTHER): Payer: Self-pay

## 2016-05-08 NOTE — Telephone Encounter (Signed)
Post ED Visit - Positive Culture Follow-up  Culture report reviewed by antimicrobial stewardship pharmacist:  []  Enzo BiNathan Batchelder, Pharm.D. []  Celedonio MiyamotoJeremy Frens, Pharm.D., BCPS []  Garvin FilaMike Maccia, Pharm.D. []  Georgina PillionElizabeth Martin, Pharm.D., BCPS []  PragueMinh Pham, [address]Ph[address]D., BCPS, AAHIVP []  Estella HuskMichelle Turner, Pharm.D., BCPS, AAHIVP []  Tennis Mustassie Stewart, Pharm.D. []  Sherle Poeob Vincent, [address]Pharm[address]  Positive urine culture, >/= 100,000 colonies -> E Coli Treated with Levofloxacin, organism sensitive to the same and no further patient follow-up is required at this time.  Arvid RightClark, Kile Kabler Dorn 05/08/2016, 9:48 AM

## 2017-10-07 IMAGING — CR DG CHEST 2V
2 series · 2 of 2 positions shown · non-contrast
Comparison: CT of the chest performed 10/05/2014, and chest
radiograph performed 10/04/2014

CLINICAL DATA: Acute onset of generalized chest pain. Initial
encounter.

EXAM:
CHEST  2 VIEW

[w chest lat]
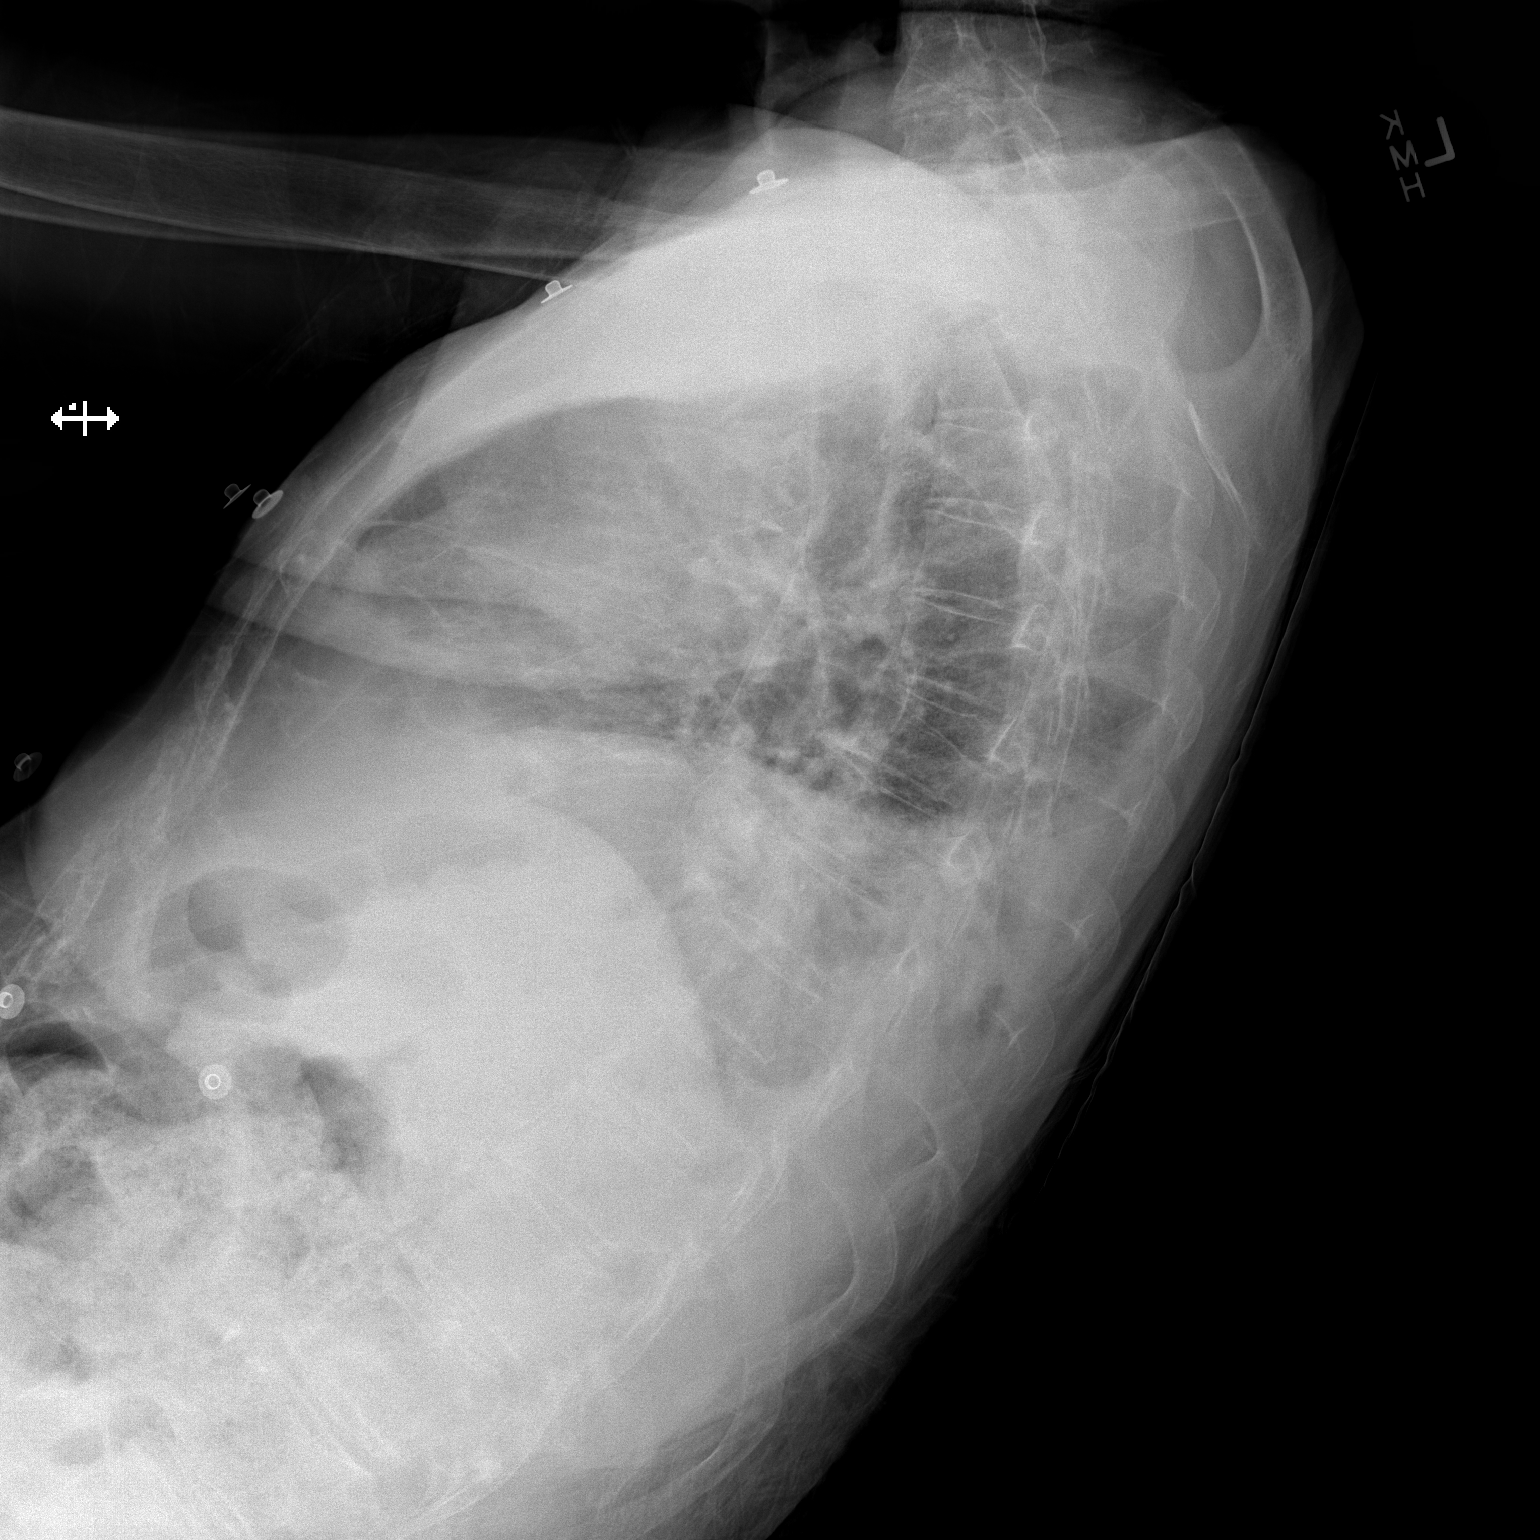

[x chest ap]
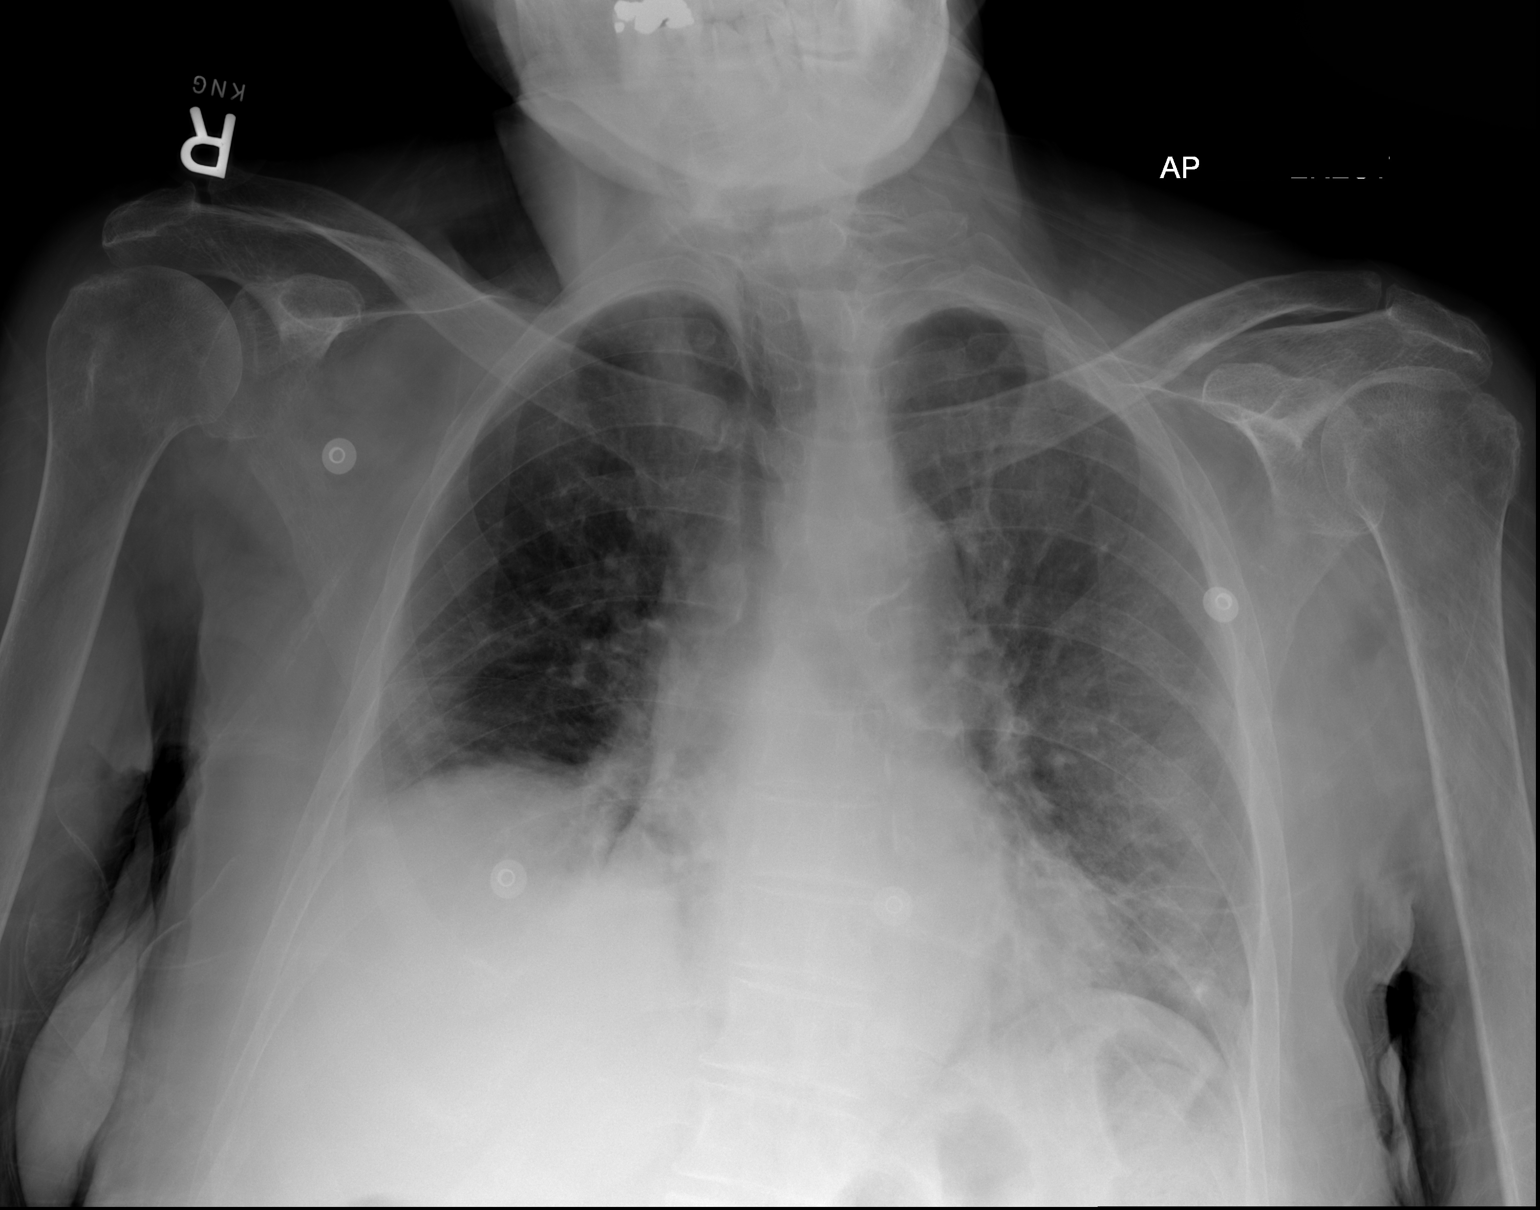

[2 of 2 positions shown; findings below may reference images not displayed]

FINDINGS: The lungs are hypoexpanded, with mild elevation of the right
hemidiaphragm. Mild bibasilar opacities may reflect atelectasis or
possibly mild infection, depending on the patient's symptoms. There
is no evidence of pleural effusion or pneumothorax.

The heart is borderline normal in size. No acute osseous
abnormalities are seen.
IMPRESSION: Lungs hypoexpanded, with mild elevation of the right hemidiaphragm.
Mild bibasilar opacities may reflect atelectasis or possibly mild
infection, depending on the patient's symptoms.

## 2019-04-29 ENCOUNTER — Emergency Department (HOSPITAL_COMMUNITY): Payer: Medicare FFS

## 2019-04-29 ENCOUNTER — Other Ambulatory Visit: Payer: Self-pay

## 2019-04-29 ENCOUNTER — Encounter (HOSPITAL_COMMUNITY): Payer: Self-pay

## 2019-04-29 ENCOUNTER — Inpatient Hospital Stay (HOSPITAL_COMMUNITY)
Admission: EM | Admit: 2019-04-29 | Discharge: 2019-05-02 | DRG: 871 | Disposition: A | Discharge status: Hospice | Payer: Medicare FFS | Source: Skilled Nursing Facility | Attending: Internal Medicine | Admitting: Internal Medicine

## 2019-04-29 DIAGNOSIS — Z79899 Other long term (current) drug therapy: Secondary | ICD-10-CM

## 2019-04-29 DIAGNOSIS — R29731 NIHSS score 31: Secondary | ICD-10-CM | POA: Diagnosis not present

## 2019-04-29 DIAGNOSIS — R7989 Other specified abnormal findings of blood chemistry: Secondary | ICD-10-CM | POA: Diagnosis not present

## 2019-04-29 DIAGNOSIS — Z7983 Long term (current) use of bisphosphonates: Secondary | ICD-10-CM

## 2019-04-29 DIAGNOSIS — I6310 Cerebral infarction due to embolism of unspecified precerebral artery: Secondary | ICD-10-CM | POA: Diagnosis not present

## 2019-04-29 DIAGNOSIS — G9389 Other specified disorders of brain: Secondary | ICD-10-CM | POA: Diagnosis present

## 2019-04-29 DIAGNOSIS — I6340 Cerebral infarction due to embolism of unspecified cerebral artery: Secondary | ICD-10-CM | POA: Diagnosis not present

## 2019-04-29 DIAGNOSIS — J9601 Acute respiratory failure with hypoxia: Secondary | ICD-10-CM | POA: Diagnosis present

## 2019-04-29 DIAGNOSIS — I10 Essential (primary) hypertension: Secondary | ICD-10-CM | POA: Diagnosis present

## 2019-04-29 DIAGNOSIS — Z8673 Personal history of transient ischemic attack (TIA), and cerebral infarction without residual deficits: Secondary | ICD-10-CM

## 2019-04-29 DIAGNOSIS — U071 COVID-19: Secondary | ICD-10-CM

## 2019-04-29 DIAGNOSIS — N390 Urinary tract infection, site not specified: Secondary | ICD-10-CM | POA: Diagnosis present

## 2019-04-29 DIAGNOSIS — R4182 Altered mental status, unspecified: Secondary | ICD-10-CM | POA: Diagnosis present

## 2019-04-29 DIAGNOSIS — D696 Thrombocytopenia, unspecified: Secondary | ICD-10-CM | POA: Diagnosis present

## 2019-04-29 DIAGNOSIS — E860 Dehydration: Secondary | ICD-10-CM | POA: Diagnosis not present

## 2019-04-29 DIAGNOSIS — E870 Hyperosmolality and hypernatremia: Secondary | ICD-10-CM | POA: Diagnosis present

## 2019-04-29 DIAGNOSIS — R29810 Facial weakness: Secondary | ICD-10-CM | POA: Diagnosis not present

## 2019-04-29 DIAGNOSIS — R6520 Severe sepsis without septic shock: Secondary | ICD-10-CM | POA: Diagnosis present

## 2019-04-29 DIAGNOSIS — E872 Acidosis: Secondary | ICD-10-CM | POA: Diagnosis present

## 2019-04-29 DIAGNOSIS — Z66 Do not resuscitate: Secondary | ICD-10-CM | POA: Diagnosis not present

## 2019-04-29 DIAGNOSIS — F0390 Unspecified dementia without behavioral disturbance: Secondary | ICD-10-CM | POA: Diagnosis not present

## 2019-04-29 DIAGNOSIS — Z515 Encounter for palliative care: Secondary | ICD-10-CM | POA: Diagnosis not present

## 2019-04-29 DIAGNOSIS — A4189 Other specified sepsis: Secondary | ICD-10-CM | POA: Diagnosis present

## 2019-04-29 DIAGNOSIS — N179 Acute kidney failure, unspecified: Secondary | ICD-10-CM | POA: Diagnosis present

## 2019-04-29 DIAGNOSIS — Z6823 Body mass index (BMI) 23.0-23.9, adult: Secondary | ICD-10-CM | POA: Diagnosis not present

## 2019-04-29 DIAGNOSIS — Z7189 Other specified counseling: Secondary | ICD-10-CM

## 2019-04-29 DIAGNOSIS — I6389 Other cerebral infarction: Secondary | ICD-10-CM | POA: Diagnosis not present

## 2019-04-29 DIAGNOSIS — F0280 Dementia in other diseases classified elsewhere without behavioral disturbance: Secondary | ICD-10-CM | POA: Diagnosis present

## 2019-04-29 DIAGNOSIS — I82402 Acute embolism and thrombosis of unspecified deep veins of left lower extremity: Secondary | ICD-10-CM | POA: Diagnosis not present

## 2019-04-29 DIAGNOSIS — Z993 Dependence on wheelchair: Secondary | ICD-10-CM

## 2019-04-29 DIAGNOSIS — J1282 Pneumonia due to coronavirus disease 2019: Secondary | ICD-10-CM | POA: Diagnosis present

## 2019-04-29 DIAGNOSIS — G309 Alzheimer's disease, unspecified: Secondary | ICD-10-CM | POA: Diagnosis present

## 2019-04-29 DIAGNOSIS — R627 Adult failure to thrive: Secondary | ICD-10-CM | POA: Diagnosis present

## 2019-04-29 DIAGNOSIS — I6330 Cerebral infarction due to thrombosis of unspecified cerebral artery: Secondary | ICD-10-CM | POA: Insufficient documentation

## 2019-04-29 DIAGNOSIS — Z79890 Hormone replacement therapy (postmenopausal): Secondary | ICD-10-CM

## 2019-04-29 DIAGNOSIS — M810 Age-related osteoporosis without current pathological fracture: Secondary | ICD-10-CM | POA: Diagnosis present

## 2019-04-29 DIAGNOSIS — I639 Cerebral infarction, unspecified: Secondary | ICD-10-CM | POA: Diagnosis not present

## 2019-04-29 LAB — COMPREHENSIVE METABOLIC PANEL
ALT: 25 U/L (ref 0–44)
AST: 36 U/L (ref 15–41)
Albumin: 3.9 g/dL (ref 3.5–5.0)
Alkaline Phosphatase: 97 U/L (ref 38–126)
Anion gap: 19 — ABNORMAL HIGH (ref 5–15)
BUN: 81 mg/dL — ABNORMAL HIGH (ref 8–23)
CO2: 18 mmol/L — ABNORMAL LOW (ref 22–32)
Calcium: 11.1 mg/dL — ABNORMAL HIGH (ref 8.9–10.3)
Chloride: 119 mmol/L — ABNORMAL HIGH (ref 98–111)
Creatinine, Ser: 4 mg/dL — ABNORMAL HIGH (ref 0.44–1.00)
GFR calc Af Amer: 11 mL/min — ABNORMAL LOW (ref >60)
GFR calc non Af Amer: 9 mL/min — ABNORMAL LOW (ref >60)
Glucose, Bld: 169 mg/dL — ABNORMAL HIGH (ref 70–99)
Potassium: 3.9 mmol/L (ref 3.5–5.1)
Sodium: 156 mmol/L — ABNORMAL HIGH (ref 135–145)
Total Bilirubin: 0.6 mg/dL (ref 0.3–1.2)
Total Protein: 8.8 g/dL — ABNORMAL HIGH (ref 6.5–8.1)

## 2019-04-29 LAB — POC SARS CORONAVIRUS 2 AG -  ED
SARS Coronavirus 2 Ag: POSITIVE — AB
SARS Coronavirus 2 Ag: NEGATIVE

## 2019-04-29 LAB — CBC WITH DIFFERENTIAL/PLATELET
Abs Immature Granulocytes: 0.08 10*3/uL — ABNORMAL HIGH (ref 0.00–0.07)
Basophils Absolute: 0 10*3/uL (ref 0.0–0.1)
Basophils Relative: 0 %
Eosinophils Absolute: 0 10*3/uL (ref 0.0–0.5)
Eosinophils Relative: 0 %
HCT: 45.7 % (ref 36.0–46.0)
Hemoglobin: 14.7 g/dL (ref 12.0–15.0)
Immature Granulocytes: 1 %
Lymphocytes Relative: 5 %
Lymphs Abs: 0.7 10*3/uL (ref 0.7–4.0)
MCH: 30.2 pg (ref 26.0–34.0)
MCHC: 32.2 g/dL (ref 30.0–36.0)
MCV: 94 fL (ref 80.0–100.0)
Monocytes Absolute: 1.2 10*3/uL — ABNORMAL HIGH (ref 0.1–1.0)
Monocytes Relative: 8 %
Neutro Abs: 12.1 10*3/uL — ABNORMAL HIGH (ref 1.7–7.7)
Neutrophils Relative %: 86 %
Platelets: 134 10*3/uL — ABNORMAL LOW (ref 150–400)
RBC: 4.86 MIL/uL (ref 3.87–5.11)
RDW: 14.1 % (ref 11.5–15.5)
WBC: 14.1 10*3/uL — ABNORMAL HIGH (ref 4.0–10.5)
nRBC: 0 % (ref 0.0–0.2)

## 2019-04-29 LAB — AMMONIA: Ammonia: 18 umol/L (ref 9–35)

## 2019-04-29 LAB — ETHANOL: Alcohol, Ethyl (B): <10 mg/dL (ref <10)

## 2019-04-29 LAB — LACTIC ACID, PLASMA: Lactic Acid, Venous: 2.3 mmol/L (ref 0.5–1.9)

## 2019-04-29 MED ORDER — ASCORBIC ACID 500 MG PO TABS
500.0000 mg | ORAL_TABLET | Freq: Every day | ORAL | Status: DC
  Administered 2019-04-29: 500 mg via ORAL
  Filled 2019-04-29 (×2): qty 1

## 2019-04-29 MED ORDER — DEXAMETHASONE SODIUM PHOSPHATE 10 MG/ML IJ SOLN
6.0000 mg | Freq: Once | INTRAMUSCULAR | Status: AC
  Administered 2019-04-29: 21:00:00 6 mg via INTRAVENOUS
  Filled 2019-04-29: qty 1

## 2019-04-29 MED ORDER — SODIUM CHLORIDE 0.9 % IV BOLUS
1000.0000 mL | Freq: Once | INTRAVENOUS | Status: AC
  Administered 2019-04-29: 19:00:00 1000 mL via INTRAVENOUS

## 2019-04-29 MED ORDER — ZINC SULFATE 220 (50 ZN) MG PO CAPS
220.0000 mg | ORAL_CAPSULE | Freq: Every day | ORAL | Status: DC
  Administered 2019-04-29: 220 mg via ORAL
  Filled 2019-04-29 (×2): qty 1

## 2019-04-29 MED ORDER — ENOXAPARIN SODIUM 40 MG/0.4ML ~~LOC~~ SOLN
40.0000 mg | Freq: Every day | SUBCUTANEOUS | Status: DC
  Administered 2019-04-29: 40 mg via SUBCUTANEOUS
  Filled 2019-04-29: qty 0.4

## 2019-04-29 MED ORDER — ONDANSETRON HCL 4 MG PO TABS: 4.0000 mg | ORAL_TABLET | Freq: Four times a day (QID) | ORAL | Status: DC | PRN

## 2019-04-29 MED ORDER — ONDANSETRON HCL 4 MG/2ML IJ SOLN: 4.0000 mg | Freq: Four times a day (QID) | INTRAMUSCULAR | Status: DC | PRN

## 2019-04-29 MED ORDER — DEXAMETHASONE SODIUM PHOSPHATE 10 MG/ML IJ SOLN
6.0000 mg | Freq: Every day | INTRAMUSCULAR | Status: DC
  Administered 2019-04-30 – 2019-05-01 (×2): 6 mg via INTRAVENOUS
  Filled 2019-04-29 (×2): qty 1

## 2019-04-29 MED ORDER — SODIUM CHLORIDE 0.9 % IV SOLN: INTRAVENOUS | Status: DC

## 2019-04-29 MED ORDER — ACETAMINOPHEN 325 MG PO TABS: 650.0000 mg | ORAL_TABLET | Freq: Four times a day (QID) | ORAL | Status: DC | PRN

## 2019-04-29 MED ORDER — SODIUM CHLORIDE 0.9 % IV SOLN
100.0000 mg | Freq: Every day | INTRAVENOUS | Status: DC
  Administered 2019-04-30 – 2019-05-02 (×3): 100 mg via INTRAVENOUS
  Filled 2019-04-29 (×3): qty 20

## 2019-04-29 MED ORDER — SODIUM CHLORIDE 0.9 % IV SOLN
200.0000 mg | Freq: Once | INTRAVENOUS | Status: AC
  Administered 2019-04-29: 200 mg via INTRAVENOUS
  Filled 2019-04-29: qty 200

## 2019-04-29 NOTE — ED Triage Notes (Signed)
EMS reports from [facility] memory care staff states Pt change of baseline, dementia pt usually sings but is now screaming. Covid + result 6 days ago, no paperwork or place of test known by staff.  BP 120/80 HR 120 RR 18 Sp02 88 RA Temp 97.6

## 2019-04-29 NOTE — ED Notes (Signed)
Dr.David notified that [place] has faxed paperwork regarding COVID results and showed a negative test on 04/11/2019 and received her first COVID Vaccine on 04/21/2019.

## 2019-04-29 NOTE — ED Notes (Signed)
[facility] called to fax over COVID test results to place into chart.

## 2019-04-29 NOTE — Progress Notes (Signed)
Pharmacy: Remdesivir   Patient is a 86 y.o. female with COVID.  Pharmacy has been consulted for remdesivir dosing.   -CXR shows "No acute cardiopulmonary findings."  -Pt requiring supplemental oxygen (No, 93% RA)  -ALT 25    A/P:  - Patient meets criteria for remdesivir. Will initiate remdesivir 200 mg once followed by 100 mg daily x 4 days.  - Daily CMET while on remdesivir - Will f/u pt's ALT and clinical condition

## 2019-04-29 NOTE — ED Notes (Signed)
Date and time results received: 04/29/19 2230  Test: Lactic Acid Critical Value: 2.3 mmol/L  Name of Provider Notified: Dr. Tarry Kos  Orders Received? Or Actions Taken?: Actions Taken: Waiting for orders from Dr. Onalee Hua

## 2019-04-29 NOTE — H&P (Addendum)
History and Physical    Renee Kramer CZY:[id number] DOB: May 04, 1928 DOA: 04/29/2019  PCP: System, Pcp Not In  Patient coming from: snf  Chief Complaint:   ams  HPI: Renee Kramer is a 86 y.o. female with medical history significant of advanced dementia reportedly Covid positive by nursing home 6 days ago but cannot find confirmation of this sent in because of altered mental status and worsening weakness and decreased p.o. intake.  All history is obtained from emergency room staff and minimal records from the nursing home patient found to be in acute kidney injury with a creatinine bump up to 4.  Hyponatremic and severely dehydrated.  O2 sats are normal chest x-ray is negative.  Patient is been given normal saline 1 L IV fluids.  Patient be referred for admission for acute kidney injury in the setting of reported Covid infection.  Review of Systems: Unobtainable secondary to dementia  Past Medical History:  Diagnosis Date  . Alzheimer disease ([place])   . Dementia ([place])     History reviewed. No pertinent surgical history.   reports that she has never smoked. She has never used smokeless tobacco. She reports that she does not drink alcohol or use drugs.  No Known Allergies  History reviewed. No pertinent family history.  unk due to dementia  Prior to Admission medications   Medication Sig Start Date End Date Taking? Authorizing Provider  acetaminophen (TYLENOL) 325 MG tablet Take 325 mg by mouth 2 (two) times daily.     [provider]  acetaminophen (TYLENOL) 500 MG tablet Take 500 mg by mouth every 4 (four) hours as needed for moderate pain, fever or headache.    [provider]  alendronate (FOSAMAX) 35 MG tablet Take 35 mg by mouth every Thursday.     [provider]  alum & mag hydroxide-simeth ([place]) 200-200-20 MG/5ML suspension Take 30 mLs by mouth every 6 (six) hours as needed for indigestion or heartburn.    [provider]  calcium carbonate  (OS-CAL - DOSED IN MG OF ELEMENTAL CALCIUM) 1250 (500 Ca) MG tablet Take 1 tablet by mouth 2 (two) times daily.    [provider]  cetirizine (ZYRTEC) 10 MG tablet Take 10 mg by mouth at bedtime.    [provider]  cholecalciferol (VITAMIN D) 1000 UNITS tablet Take 1,000 Units by mouth daily with breakfast.     [provider]  clobetasol ointment (TEMOVATE) 0.93 % Apply 1 application topically every 6 (six) hours as needed (rash).     [provider]  Cranberry 450 MG TABS Take 450 mg by mouth daily with breakfast.     [provider]  donepezil (ARICEPT) 10 MG tablet Take 10 mg by mouth at bedtime.    [provider]  fluticasone (FLONASE) 50 MCG/ACT nasal spray Place 2 sprays into both nostrils daily.    [provider]  guaifenesin (ROBAFEN) 100 MG/5ML syrup Take 200 mg by mouth every 6 (six) hours as needed for cough.    [provider]  lisinopril (PRINIVIL,ZESTRIL) 5 MG tablet Take 5 mg by mouth every morning.    [provider]  loperamide (IMODIUM) 2 MG capsule Take 2 mg by mouth as needed for diarrhea or loose stools.    [provider]  magnesium hydroxide (MILK OF MAGNESIA) 400 MG/5ML suspension Take 30 mLs by mouth at bedtime as needed for mild constipation.    [provider]  megestrol (MEGACE) 400 MG/10ML suspension Take 400  mg by mouth 2 (two) times daily.    [provider]  Melatonin 1 MG TABS Take 2 mg by mouth at bedtime.     [provider]  Multiple Vitamin (TAB-A-VITE) TABS Take 1 tablet by mouth daily with breakfast.    [provider]  Neomycin-Bacitracin-Polymyxin (TRIPLE ANTIBIOTIC) [phone] OINT Apply 1 application topically as needed.    [provider]  PRESCRIPTION MEDICATION Take 118 mLs by mouth 3 (three) times daily. Mighty shakes    [provider]  senna (SENOKOT) 8.6 MG tablet Take 2 tablets by mouth every morning.      [provider]  sertraline (ZOLOFT) 50 MG tablet Take 50 mg by mouth every morning.    [provider]    Physical Exam: Vitals:   04/29/19 1852 04/29/19 2030 04/29/19 2045 04/29/19 2100  BP: 118/80 (!) 128/54  138/69  Pulse: 88  100   Resp: 16 (!) 26 (!) 27 (!) 24  Temp:      TempSrc:      SpO2: 94%  93%       Constitutional: NAD, calm, comfortable mucous membranes dry Vitals:   04/29/19 1852 04/29/19 2030 04/29/19 2045 04/29/19 2100  BP: 118/80 (!) 128/54  138/69  Pulse: 88  100   Resp: 16 (!) 26 (!) 27 (!) 24  Temp:      TempSrc:      SpO2: 94%  93%    Eyes: PERRL, lids and conjunctivae normal ENMT: Mucous membranes are dry. Posterior pharynx clear of any exudate or lesions.Normal dentition.  Neck: normal, supple, no masses, no thyromegaly Respiratory: clear to auscultation bilaterally, no wheezing, no crackles. Normal respiratory effort. No accessory muscle use.  Cardiovascular: Regular rate and rhythm, no murmurs / rubs / gallops. No extremity edema. 2+ pedal pulses. No carotid bruits.  Abdomen: no tenderness, no masses palpated. No hepatosplenomegaly. Bowel sounds positive.  Musculoskeletal: no clubbing / cyanosis. No joint deformity upper and lower extremities. Good ROM, no contractures. Normal muscle tone.  Skin: no rashes, lesions, ulcers. No induration Neurologic: CN 2-12 grossly intact. Sensation intact, DTR normal. Strength 5/5 in all 4.  Psychiatric:  Alert and oriented x 0. Normal mood.    Labs on Admission: I have personally reviewed following labs and imaging studies  CBC: Recent Labs  Lab 04/29/19 1819  WBC 14.1*  NEUTROABS 12.1*  HGB 14.7  HCT 45.7  MCV 94.0  PLT 134*   Basic Metabolic Panel: Recent Labs  Lab 04/29/19 1819  NA 156*  K 3.9  CL 119*  CO2 18*  GLUCOSE 169*  BUN 81*  CREATININE 4.00*  CALCIUM 11.1*   GFR: CrCl cannot be calculated (Unknown ideal weight.). Liver Function Tests: Recent Labs  Lab  04/29/19 1819  AST 36  ALT 25  ALKPHOS 97  BILITOT 0.6  PROT 8.8*  ALBUMIN 3.9   No results for input(s): LIPASE, AMYLASE in the last 168 hours. Recent Labs  Lab 04/29/19 1819  AMMONIA 18   Coagulation Profile: No results for input(s): INR, PROTIME in the last 168 hours. Cardiac Enzymes: No results for input(s): CKTOTAL, CKMB, CKMBINDEX, TROPONINI in the last 168 hours. BNP (last 3 results) No results for input(s): PROBNP in the last 8760 hours. HbA1C: No results for input(s): HGBA1C in the last 72 hours. CBG: No results for input(s): GLUCAP in the last 168 hours. Lipid Profile: No results for input(s): CHOL, HDL, LDLCALC, TRIG, CHOLHDL, LDLDIRECT in the last 72 hours. Thyroid Function  Tests: No results for input(s): TSH, T4TOTAL, FREET4, T3FREE, THYROIDAB in the last 72 hours. Anemia Panel: No results for input(s): VITAMINB12, FOLATE, FERRITIN, TIBC, IRON, RETICCTPCT in the last 72 hours. Urine analysis:    Component Value Date/Time   COLORURINE YELLOW 05/04/2016 1851   APPEARANCEUR HAZY (A) 05/04/2016 1851   LABSPEC 1.016 05/04/2016 1851   PHURINE 6.0 05/04/2016 1851   GLUCOSEU NEGATIVE 05/04/2016 1851   HGBUR NEGATIVE 05/04/2016 1851   BILIRUBINUR NEGATIVE 05/04/2016 1851   KETONESUR NEGATIVE 05/04/2016 1851   PROTEINUR NEGATIVE 05/04/2016 1851   NITRITE POSITIVE (A) 05/04/2016 1851   LEUKOCYTESUR LARGE (A) 05/04/2016 1851   Sepsis Labs: !!!!!!!!!!!!!!!!!!!!!!!!!!!!!!!!!!!!!!!!!!!! @LABRCNTIP (procalcitonin:4,lacticidven:4) )No results found for this or any previous visit (from the past 240 hour(s)).   Radiological Exams on Admission: CT HEAD WO CONTRAST  Result Date: 04/29/2019 CLINICAL DATA:  Altered mental status. EXAM: CT HEAD WITHOUT CONTRAST TECHNIQUE: Contiguous axial images were obtained from the base of the skull through the vertex without intravenous contrast. COMPARISON:  May 04, 2016 FINDINGS: Brain: There is moderate severity cerebral atrophy  with widening of the extra-axial spaces and ventricular dilatation. There are areas of decreased attenuation within the white matter tracts of the supratentorial brain, consistent with microvascular disease changes. A small to moderate sized area of cortical encephalomalacia, with adjacent chronic white matter low attenuation, is seen within the left frontal lobe. This represents a new finding when compared to the prior study. Vascular: No hyperdense vessel or unexpected calcification. Skull: Normal. Negative for fracture or focal lesion. Sinuses/Orbits: There is very mild bilateral ethmoid sinus mucosal thickening. Other: None. IMPRESSION: 1. Generalized cerebral atrophy. 2. No acute intracranial abnormality. 3. Chronic left frontal lobe infarct. Electronically Signed   By: May 06, 2016 M.D.   On: 04/29/2019 18:47   DG Chest Port 1 View  Result Date: 04/29/2019 CLINICAL DATA:  Altered mental status EXAM: PORTABLE CHEST 1 VIEW COMPARISON:  05/04/2016 FINDINGS: Patient is rotated. Heart size within normal limits. Calcific aortic knob. No focal airspace consolidation. No pleural effusion or pneumothorax. No acute osseous findings. IMPRESSION: No acute cardiopulmonary findings. Electronically Signed   By: 07/02/2016 D.O.   On: 04/29/2019 18:44   Old chart reviewed Case cussed with EDP Chest x-ray reviewed no edema or infiltrate  Assessment/Plan 86 year old female with Covid infection the last 6 days at SNF comes in with acute kidney injury failure to thrive Principal Problem:   AKI (acute kidney injury) (HCC)-prerenal secondary to dehydration.  Give a liter of fluid.  Placed on normal saline overnight.  Monitor urinary output closely.  Avoid all nephrotoxic substances.  Urinalysis is pending.  Active Problems:   COVID-19 virus infection-initial point-of-care Covid test was positive and then a repeat was done because of clerical error which is negative.  Obtaining confirmation of Covid test  from nursing home.  Covid PCR is pending. Admit to [facility] if indeed Covid positive.  Inflammatory markers pending.    FTT (failure to thrive) in adult-as above IV fluids    Dehydration-IV fluids    Hypernatremia-due to dehydration repeat BMP in the morning    Dementia (HCC)-noted     DVT prophylaxis: scds Code Status:  full code per nursing home paperwork Family Communication: none Disposition Plan: days Consults called:  none Admission status:  admission   Jewett Mcgann A MD [facility]  If 7PM-7AM, please contact night-coverage [email or web address] Password [facility]  04/29/2019, 9:41 PM   Cannot confirm patient was ever Covid positive.  Reported by  EDP Dr. Adela Lank she was Covid +6 days ago.  Repeat PCR is pending.

## 2019-04-29 NOTE — ED Provider Notes (Signed)
[facility]-EMERGENCY DEPT Provider Note   CSN: [id number] Arrival date & time: 04/29/19  1741     History Chief Complaint  Patient presents with  . COVID +  . Altered Mental Status    Renee Kramer is a 86 y.o. female.  86 yo F with a chief complaint of altered mental status.  Patient lives in a memory care unit.  Typically is singing throughout the day but has been screaming.  No obvious areas of pain per the nursing home staff.  No recent falls.  Patient is altered and unable to provide history.  Level 5 caveat.  The history is provided by the patient.  Altered Mental Status Associated symptoms: no fever, no headaches, no nausea, no palpitations and no vomiting   Illness Severity:  Moderate Onset quality:  Gradual Duration:  1 day Timing:  Constant Progression:  Unchanged Chronicity:  New Associated symptoms: no chest pain, no congestion, no fever, no headaches, no myalgias, no nausea, no rhinorrhea, no shortness of breath, no vomiting and no wheezing        Past Medical History:  Diagnosis Date  . Alzheimer disease (HCC)   . Dementia [facility])     Patient Active Problem List   Diagnosis Date Noted  . AKI (acute kidney injury) (HCC) 04/29/2019  . COVID-19 virus infection 04/29/2019  . FTT (failure to thrive) in adult 04/29/2019  . Dehydration 04/29/2019  . Hypernatremia 04/29/2019  . Dementia (HCC) 04/29/2019    History reviewed. No pertinent surgical history.   OB History   No obstetric history on file.     History reviewed. No pertinent family history.  Social History   Tobacco Use  . Smoking status: Never Smoker  . Smokeless tobacco: Never Used  Substance Use Topics  . Alcohol use: No  . Drug use: No    Home Medications Prior to Admission medications   Medication Sig Start Date End Date Taking? Authorizing Provider  acetaminophen (TYLENOL) 325 MG tablet Take 325 mg by mouth 2 (two) times daily.     [provider]    acetaminophen (TYLENOL) 500 MG tablet Take 500 mg by mouth every 4 (four) hours as needed for moderate pain, fever or headache.    [provider]  alendronate (FOSAMAX) 35 MG tablet Take 35 mg by mouth every Thursday.     [provider]  alum & mag hydroxide-simeth ([place]) 200-200-20 MG/5ML suspension Take 30 mLs by mouth every 6 (six) hours as needed for indigestion or heartburn.    [provider]  calcium carbonate (OS-CAL - DOSED IN MG OF ELEMENTAL CALCIUM) 1250 (500 Ca) MG tablet Take 1 tablet by mouth 2 (two) times daily.    [provider]  cetirizine (ZYRTEC) 10 MG tablet Take 10 mg by mouth at bedtime.    [provider]  cholecalciferol (VITAMIN D) 1000 UNITS tablet Take 1,000 Units by mouth daily with breakfast.     [provider]  clobetasol ointment (TEMOVATE) 0.05 % Apply 1 application topically every 6 (six) hours as needed (rash).     [provider]  Cranberry 450 MG TABS Take 450 mg by mouth daily with breakfast.     [provider]  donepezil (ARICEPT) 10 MG tablet Take 10 mg by mouth at bedtime.    [provider]  fluticasone (FLONASE) 50 MCG/ACT nasal spray Place 2 sprays into both nostrils daily.    [provider]  guaifenesin (ROBAFEN) 100 MG/5ML syrup  Take 200 mg by mouth every 6 (six) hours as needed for cough.    [provider]  lisinopril (PRINIVIL,ZESTRIL) 5 MG tablet Take 5 mg by mouth every morning.    [provider]  loperamide (IMODIUM) 2 MG capsule Take 2 mg by mouth as needed for diarrhea or loose stools.    [provider]  magnesium hydroxide (MILK OF MAGNESIA) 400 MG/5ML suspension Take 30 mLs by mouth at bedtime as needed for mild constipation.    [provider]  megestrol (MEGACE) 400 MG/10ML suspension Take 400 mg by mouth 2 (two) times daily.    [provider]  Melatonin 1 MG TABS Take 2 mg by mouth at bedtime.      [provider]  Multiple Vitamin (TAB-A-VITE) TABS Take 1 tablet by mouth daily with breakfast.    [provider]  Neomycin-Bacitracin-Polymyxin (TRIPLE ANTIBIOTIC) [phone] OINT Apply 1 application topically as needed.    [provider]  PRESCRIPTION MEDICATION Take 118 mLs by mouth 3 (three) times daily. Mighty shakes    [provider]  senna (SENOKOT) 8.6 MG tablet Take 2 tablets by mouth every morning.     [provider]  sertraline (ZOLOFT) 50 MG tablet Take 50 mg by mouth every morning.    [provider]    Allergies    Patient has no known allergies.  Review of Systems   Review of Systems  Constitutional: Negative for chills and fever.  HENT: Negative for congestion and rhinorrhea.   Eyes: Negative for redness and visual disturbance.  Respiratory: Negative for shortness of breath and wheezing.   Cardiovascular: Negative for chest pain and palpitations.  Gastrointestinal: Negative for nausea and vomiting.  Genitourinary: Negative for dysuria and urgency.  Musculoskeletal: Negative for arthralgias and myalgias.  Skin: Negative for pallor and wound.  Neurological: Negative for dizziness and headaches.    Physical Exam Updated Vital Signs BP 138/69   Pulse 100   Temp 97.7 F (36.5 C) (Oral)   Resp (!) 24   SpO2 93%   Physical Exam Vitals and nursing note reviewed.  Constitutional:      General: She is not in acute distress.    Appearance: She is well-developed. She is not diaphoretic.  HENT:     Head: Normocephalic and atraumatic.  Eyes:     Pupils: Pupils are equal, round, and reactive to light.  Cardiovascular:     Rate and Rhythm: Normal rate and regular rhythm.     Heart sounds: No murmur. No friction rub. No gallop.   Pulmonary:     Effort: Pulmonary effort is normal.     Breath sounds: No wheezing or rales.  Abdominal:     General: There is no distension.     Palpations: Abdomen is soft.      Tenderness: There is no abdominal tenderness.  Musculoskeletal:        General: No tenderness.     Cervical back: Normal range of motion and neck supple.  Skin:    General: Skin is warm and dry.  Neurological:     Mental Status: She is alert.     Comments: Wakes up to voice.  Mumbles incoherently.  Psychiatric:        Behavior: Behavior normal.     ED Results / Procedures / Treatments   Labs (all labs ordered are listed, but only abnormal results are displayed) Labs Reviewed  COMPREHENSIVE METABOLIC PANEL - Abnormal; Notable for the following components:  Result Value   Sodium 156 (*)    Chloride 119 (*)    CO2 18 (*)    Glucose, Bld 169 (*)    BUN 81 (*)    Creatinine, Ser 4.00 (*)    Calcium 11.1 (*)    Total Protein 8.8 (*)    GFR calc non Af Amer 9 (*)    GFR calc Af Amer 11 (*)    Anion gap 19 (*)    All other components within normal limits  CBC WITH DIFFERENTIAL/PLATELET - Abnormal; Notable for the following components:   WBC 14.1 (*)    Platelets 134 (*)    Neutro Abs 12.1 (*)    Monocytes Absolute 1.2 (*)    Abs Immature Granulocytes 0.08 (*)    All other components within normal limits  BLOOD GAS, VENOUS - Abnormal; Notable for the following components:   pCO2, Ven 41.6 (*)    pO2, Ven <31.0 (*)    Bicarbonate 19.2 (*)    Acid-base deficit 6.7 (*)    All other components within normal limits  POC SARS CORONAVIRUS 2 AG -  ED - Abnormal; Notable for the following components:   SARS Coronavirus 2 Ag POSITIVE (*)    All other components within normal limits  CULTURE, BLOOD (ROUTINE X 2)  CULTURE, BLOOD (ROUTINE X 2)  AMMONIA  ETHANOL  URINALYSIS, ROUTINE W REFLEX MICROSCOPIC  LACTIC ACID, PLASMA  LACTIC ACID, PLASMA  CBC  CREATININE, SERUM  C-REACTIVE PROTEIN  D-DIMER, QUANTITATIVE (NOT AT [facility])  CBC WITH DIFFERENTIAL/PLATELET  COMPREHENSIVE METABOLIC PANEL  C-REACTIVE PROTEIN  D-DIMER, QUANTITATIVE (NOT AT [facility])  POC SARS CORONAVIRUS 2 AG -   ED  ABO/RH    EKG None  Radiology CT HEAD WO CONTRAST  Result Date: 04/29/2019 CLINICAL DATA:  Altered mental status. EXAM: CT HEAD WITHOUT CONTRAST TECHNIQUE: Contiguous axial images were obtained from the base of the skull through the vertex without intravenous contrast. COMPARISON:  May 04, 2016 FINDINGS: Brain: There is moderate severity cerebral atrophy with widening of the extra-axial spaces and ventricular dilatation. There are areas of decreased attenuation within the white matter tracts of the supratentorial brain, consistent with microvascular disease changes. A small to moderate sized area of cortical encephalomalacia, with adjacent chronic white matter low attenuation, is seen within the left frontal lobe. This represents a new finding when compared to the prior study. Vascular: No hyperdense vessel or unexpected calcification. Skull: Normal. Negative for fracture or focal lesion. Sinuses/Orbits: There is very mild bilateral ethmoid sinus mucosal thickening. Other: None. IMPRESSION: 1. Generalized cerebral atrophy. 2. No acute intracranial abnormality. 3. Chronic left frontal lobe infarct. Electronically Signed   By: Virgina Norfolk M.D.   On: 04/29/2019 18:47   DG Chest Port 1 View  Result Date: 04/29/2019 CLINICAL DATA:  Altered mental status EXAM: PORTABLE CHEST 1 VIEW COMPARISON:  05/04/2016 FINDINGS: Patient is rotated. Heart size within normal limits. Calcific aortic knob. No focal airspace consolidation. No pleural effusion or pneumothorax. No acute osseous findings. IMPRESSION: No acute cardiopulmonary findings. Electronically Signed   By: Davina Poke D.O.   On: 04/29/2019 18:44    Procedures Procedures (including critical care time)  Medications Ordered in ED Medications  enoxaparin (LOVENOX) injection 40 mg (has no administration in time range)  0.9 %  sodium chloride infusion (has no administration in time range)  remdesivir 200 mg in sodium chloride 0.9% 250  mL IVPB (has no administration in time range)    Followed  by  remdesivir 100 mg in sodium chloride 0.9 % 100 mL IVPB (has no administration in time range)  dexamethasone (DECADRON) injection 6 mg (has no administration in time range)  ascorbic acid (VITAMIN C) tablet 500 mg (has no administration in time range)  zinc sulfate capsule 220 mg (has no administration in time range)  ondansetron (ZOFRAN) tablet 4 mg (has no administration in time range)    Or  ondansetron (ZOFRAN) injection 4 mg (has no administration in time range)  acetaminophen (TYLENOL) tablet 650 mg (has no administration in time range)  sodium chloride 0.9 % bolus 1,000 mL (0 mLs Intravenous Stopped 04/29/19 2120)  dexamethasone (DECADRON) injection 6 mg (6 mg Intravenous Given 04/29/19 2120)    ED Course  I have reviewed the triage vital signs and the nursing notes.  Pertinent labs & imaging results that were available during my care of the patient were reviewed by me and considered in my medical decision making (see chart for details).    MDM Rules/Calculators/A&P                      86 yo F with a chief complaints of altered mental status.  Patient is demented at baseline and typically is singing but was yelling today like she might be in pain.  Seems to be uncomfortable with palpation all over her body without focality.  She has been diagnosed with the novel coronavirus these per her facility so maybe she is having diffuse myalgias.  Not hypoxic.  Will obtain an altered mental status work-up.  Bolus of IV fluids.  Reassess.  Patient continues to be as she was when she arrived.  Lab work with kidney injury.  Unsure of acuity though expected to be acute with a metabolic acidosis and uremia.  Sodium was also 156.  Will discuss with medicine for admission.  CRITICAL CARE Performed by: Rae Roam   Total critical care time: 35 minutes  Critical care time was exclusive of separately billable procedures and  treating other patients.  Critical care was necessary to treat or prevent imminent or life-threatening deterioration.  Critical care was time spent personally by me on the following activities: development of treatment plan with patient and/or surrogate as well as nursing, discussions with consultants, evaluation of patient's response to treatment, examination of patient, obtaining history from patient or surrogate, ordering and performing treatments and interventions, ordering and review of laboratory studies, ordering and review of radiographic studies, pulse oximetry and re-evaluation of patient's condition.  The patients results and plan were reviewed and discussed.   Any x-rays performed were independently reviewed by myself.   Differential diagnosis were considered with the presenting HPI.  Medications  enoxaparin (LOVENOX) injection 40 mg (has no administration in time range)  0.9 %  sodium chloride infusion (has no administration in time range)  remdesivir 200 mg in sodium chloride 0.9% 250 mL IVPB (has no administration in time range)    Followed by  remdesivir 100 mg in sodium chloride 0.9 % 100 mL IVPB (has no administration in time range)  dexamethasone (DECADRON) injection 6 mg (has no administration in time range)  ascorbic acid (VITAMIN C) tablet 500 mg (has no administration in time range)  zinc sulfate capsule 220 mg (has no administration in time range)  ondansetron (ZOFRAN) tablet 4 mg (has no administration in time range)    Or  ondansetron (ZOFRAN) injection 4 mg (has no administration in time range)  acetaminophen (TYLENOL)  tablet 650 mg (has no administration in time range)  sodium chloride 0.9 % bolus 1,000 mL (0 mLs Intravenous Stopped 04/29/19 2120)  dexamethasone (DECADRON) injection 6 mg (6 mg Intravenous Given 04/29/19 2120)    Vitals:   04/29/19 1852 04/29/19 2030 04/29/19 2045 04/29/19 2100  BP: 118/80 (!) 128/54  138/69  Pulse: 88  100   Resp: 16 (!) 26  (!) 27 (!) 24  Temp:      TempSrc:      SpO2: 94%  93%     Final diagnoses:  COVID-19 virus infection  AKI (acute kidney injury) (HCC)  Dehydration    Admission/ observation were discussed with the admitting physician, patient and/or family and they are comfortable with the plan.    Final Clinical Impression(s) / ED Diagnoses Final diagnoses:  COVID-19 virus infection  AKI (acute kidney injury) (HCC)  Dehydration    Rx / DC Orders ED Discharge Orders    None       Melene PlanFloyd, Raahil Ong, DO 04/29/19 2221

## 2019-04-30 ENCOUNTER — Inpatient Hospital Stay (HOSPITAL_COMMUNITY): Payer: Medicare FFS

## 2019-04-30 ENCOUNTER — Other Ambulatory Visit: Payer: Self-pay

## 2019-04-30 DIAGNOSIS — N179 Acute kidney failure, unspecified: Secondary | ICD-10-CM

## 2019-04-30 DIAGNOSIS — D696 Thrombocytopenia, unspecified: Secondary | ICD-10-CM

## 2019-04-30 DIAGNOSIS — R7989 Other specified abnormal findings of blood chemistry: Secondary | ICD-10-CM

## 2019-04-30 DIAGNOSIS — E860 Dehydration: Secondary | ICD-10-CM

## 2019-04-30 DIAGNOSIS — U071 COVID-19: Secondary | ICD-10-CM

## 2019-04-30 DIAGNOSIS — Z7189 Other specified counseling: Secondary | ICD-10-CM

## 2019-04-30 LAB — COMPREHENSIVE METABOLIC PANEL
ALT: 21 U/L (ref 0–44)
AST: 28 U/L (ref 15–41)
Albumin: 3 g/dL — ABNORMAL LOW (ref 3.5–5.0)
Alkaline Phosphatase: 78 U/L (ref 38–126)
Anion gap: 14 (ref 5–15)
BUN: 81 mg/dL — ABNORMAL HIGH (ref 8–23)
CO2: 16 mmol/L — ABNORMAL LOW (ref 22–32)
Calcium: 9.7 mg/dL (ref 8.9–10.3)
Chloride: 124 mmol/L — ABNORMAL HIGH (ref 98–111)
Creatinine, Ser: 3.08 mg/dL — ABNORMAL HIGH (ref 0.44–1.00)
GFR calc Af Amer: 15 mL/min — ABNORMAL LOW (ref >60)
GFR calc non Af Amer: 13 mL/min — ABNORMAL LOW (ref >60)
Glucose, Bld: 188 mg/dL — ABNORMAL HIGH (ref 70–99)
Potassium: 3.6 mmol/L (ref 3.5–5.1)
Sodium: 154 mmol/L — ABNORMAL HIGH (ref 135–145)
Total Bilirubin: 0.4 mg/dL (ref 0.3–1.2)
Total Protein: 6.9 g/dL (ref 6.5–8.1)

## 2019-04-30 LAB — URINALYSIS, ROUTINE W REFLEX MICROSCOPIC
Bilirubin Urine: NEGATIVE
Glucose, UA: NEGATIVE mg/dL
Hgb urine dipstick: NEGATIVE
Ketones, ur: 5 mg/dL — AB
Nitrite: NEGATIVE
Protein, ur: 100 mg/dL — AB
Specific Gravity, Urine: 1.019 (ref 1.005–1.030)
pH: 8 (ref 5.0–8.0)

## 2019-04-30 LAB — BLOOD GAS, ARTERIAL
Acid-base deficit: 5.7 mmol/L — ABNORMAL HIGH (ref 0.0–2.0)
Bicarbonate: 16.9 mmol/L — ABNORMAL LOW (ref 20.0–28.0)
O2 Saturation: 93.2 %
Patient temperature: 96.4
pCO2 arterial: 27 mmHg — ABNORMAL LOW (ref 32.0–48.0)
pH, Arterial: 7.407 (ref 7.350–7.450)
pO2, Arterial: 67.8 mmHg — ABNORMAL LOW (ref 83.0–108.0)

## 2019-04-30 LAB — LACTIC ACID, PLASMA
Lactic Acid, Venous: 2.4 mmol/L (ref 0.5–1.9)
Lactic Acid, Venous: 1.6 mmol/L (ref 0.5–1.9)
Lactic Acid, Venous: 1.6 mmol/L (ref 0.5–1.9)

## 2019-04-30 LAB — CBC WITH DIFFERENTIAL/PLATELET
Abs Immature Granulocytes: 0.05 10*3/uL (ref 0.00–0.07)
Basophils Absolute: 0 10*3/uL (ref 0.0–0.1)
Basophils Relative: 0 %
Eosinophils Absolute: 0 10*3/uL (ref 0.0–0.5)
Eosinophils Relative: 0 %
HCT: 38.3 % (ref 36.0–46.0)
Hemoglobin: 12.5 g/dL (ref 12.0–15.0)
Immature Granulocytes: 1 %
Lymphocytes Relative: 6 %
Lymphs Abs: 0.6 10*3/uL — ABNORMAL LOW (ref 0.7–4.0)
MCH: 30.3 pg (ref 26.0–34.0)
MCHC: 32.6 g/dL (ref 30.0–36.0)
MCV: 92.7 fL (ref 80.0–100.0)
Monocytes Absolute: 0.4 10*3/uL (ref 0.1–1.0)
Monocytes Relative: 4 %
Neutro Abs: 9 10*3/uL — ABNORMAL HIGH (ref 1.7–7.7)
Neutrophils Relative %: 89 %
Platelets: 60 10*3/uL — ABNORMAL LOW (ref 150–400)
RBC: 4.13 MIL/uL (ref 3.87–5.11)
RDW: 14.2 % (ref 11.5–15.5)
WBC: 10.1 10*3/uL (ref 4.0–10.5)
nRBC: 0 % (ref 0.0–0.2)

## 2019-04-30 LAB — BLOOD GAS, VENOUS
Acid-base deficit: 6.7 mmol/L — ABNORMAL HIGH (ref 0.0–2.0)
Bicarbonate: 19.2 mmol/L — ABNORMAL LOW (ref 20.0–28.0)
O2 Saturation: 31.9 %
Patient temperature: 98.6
pCO2, Ven: 41.6 mmHg — ABNORMAL LOW (ref 44.0–60.0)
pH, Ven: 7.287 (ref 7.250–7.430)
pO2, Ven: <31 mmHg — CL (ref 32.0–45.0)

## 2019-04-30 LAB — D-DIMER, QUANTITATIVE
D-Dimer, Quant: >20 ug/mL-FEU — ABNORMAL HIGH (ref 0.00–0.50)
D-Dimer, Quant: >20 ug/mL-FEU — ABNORMAL HIGH (ref 0.00–0.50)

## 2019-04-30 LAB — C-REACTIVE PROTEIN
CRP: 14.7 mg/dL — ABNORMAL HIGH (ref <1.0)
CRP: 14.9 mg/dL — ABNORMAL HIGH (ref <1.0)

## 2019-04-30 LAB — GLUCOSE, CAPILLARY: Glucose-Capillary: 183 mg/dL — ABNORMAL HIGH (ref 70–99)

## 2019-04-30 LAB — ABO/RH: ABO/RH(D): A POS

## 2019-04-30 LAB — SARS CORONAVIRUS 2 (TAT 6-24 HRS): SARS Coronavirus 2: POSITIVE — AB

## 2019-04-30 LAB — MRSA PCR SCREENING: MRSA by PCR: NEGATIVE

## 2019-04-30 MED ORDER — CHLORHEXIDINE GLUCONATE CLOTH 2 % EX PADS
6.0000 | MEDICATED_PAD | Freq: Every day | CUTANEOUS | Status: DC
  Administered 2019-04-30 – 2019-05-01 (×2): 6 via TOPICAL

## 2019-04-30 MED ORDER — HEPARIN SODIUM (PORCINE) 10000 UNIT/ML IJ SOLN
7500.0000 [IU] | Freq: Three times a day (TID) | INTRAMUSCULAR | Status: DC
  Administered 2019-04-30 – 2019-05-01 (×2): 7500 [IU] via SUBCUTANEOUS
  Filled 2019-04-30 (×4): qty 1

## 2019-04-30 MED ORDER — CHLORHEXIDINE GLUCONATE 0.12 % MT SOLN
15.0000 mL | Freq: Two times a day (BID) | OROMUCOSAL | Status: DC
  Administered 2019-04-30 – 2019-05-02 (×4): 15 mL via OROMUCOSAL
  Filled 2019-04-30 (×3): qty 15

## 2019-04-30 MED ORDER — METOPROLOL TARTRATE 5 MG/5ML IV SOLN
2.5000 mg | Freq: Four times a day (QID) | INTRAVENOUS | Status: DC
  Administered 2019-04-30 – 2019-05-02 (×7): 2.5 mg via INTRAVENOUS
  Filled 2019-04-30 (×7): qty 5

## 2019-04-30 MED ORDER — ORAL CARE MOUTH RINSE
15.0000 mL | Freq: Two times a day (BID) | OROMUCOSAL | Status: DC
  Administered 2019-05-01 – 2019-05-02 (×4): 15 mL via OROMUCOSAL

## 2019-04-30 MED ORDER — HEPARIN SODIUM (PORCINE) 5000 UNIT/ML IJ SOLN: 5000.0000 [IU] | Freq: Three times a day (TID) | INTRAMUSCULAR | Status: DC

## 2019-04-30 MED ORDER — SODIUM CHLORIDE 0.9 % IV SOLN
1.0000 g | INTRAVENOUS | Status: DC
  Administered 2019-04-30 – 2019-05-02 (×3): 1 g via INTRAVENOUS
  Filled 2019-04-30: qty 10
  Filled 2019-04-30 (×2): qty 1

## 2019-04-30 NOTE — Progress Notes (Signed)
Contacted Respiratory therapy concerning patients saturations and breathing along with unresponsiveness.  RT recommended placing patient on NRB mask 15L.  She also stated that d/t patient Full Code status, physician should have critical care consult patient concerning her vitals.  Sent a message to physician with update and concerns of patient status, awaiting response. Patient saturating at 95% ON 15L NRB mask.  RR 32; hr 117.

## 2019-04-30 NOTE — Progress Notes (Signed)
PROGRESS NOTE    Renee ChuteWilma Kramer  ZOX:[id number] DOB: 12/20/1928 DOA: 04/29/2019 PCP: Patient, No Pcp Per    Brief Narrative:  86 y.o. female with medical history significant of advanced dementia reportedly Covid positive by nursing home 6 days ago but cannot find confirmation of this sent in because of altered mental status and worsening weakness and decreased p.o. intake.  All history is obtained from emergency room staff and minimal records from the nursing home patient found to be in acute kidney injury with a creatinine bump up to 4.  Hyponatremic and severely dehydrated.  O2 sats are normal chest x-ray is negative.  Patient is been given normal saline 1 L IV fluids.  Patient be referred for admission for acute kidney injury in the setting of reported Covid infection  Assessment & Plan:   Principal Problem:   Sepsis due to COVID-19 [facility](HCC) Active Problems:   AKI (acute kidney injury) (HCC)   FTT (failure to thrive) in adult   Dehydration   Hypernatremia   Dementia (HCC)   Thrombocytopenia (HCC)   DNR (do not resuscitate) discussion   1. Severe sepsis with COVID pneumonia and UTI 1. Testing confirms COVID + 2. Currently on remdesivir and decadron 3. UA suggestive of UTI. Have ordered empiric rocephin 4. Pt is acutely ill with >50% drop in platelets overnight. Likely not candidate for actemra 5. CRP is elevated at 14.9 6. Currently requiring 15L high flow O2 7. See below, will continue above tx and if pt further declines, would consider focus on comfort at that time 8. Given hemodynamic instability, consulted critical care. Appreciate input. Now DNR with no plans for escalation of care 2. ARF 1. Suspect secondary to dehydration 2. Cr has improved with IVF hydration 3. Will continue IVF as tolerated 3. Dehydration 1. Per above, would continue IVF hydration as tolerated 4. Failure to thrive 1. Currently NPO given lethargy 2. Continue hydration as  tolerated 5. Hypernatremia 1. Suspect related to above dehydration 2. Somewhat improved with fluids 6. LLE DVT 1. Elevated d-dimer markedly elevated at time of presentation at >20 2. LE dopplers pos for LLE DVT 3. Pt currently on high flow O2. Suspicion for PE is high. Given thrombocytopenia, cannot safely anticoagulate given risk of life-threatening bleed. Also, given renal failure, not candidate for CTA chest 4. Discussed concerns with family who agrees to hold off on therapeutic anticoagulation 5. Discussed with pharmacy. Recommendation for increasing dose of subq heparin for now 7. Thrombocytopenia 1. Presenting plts of 134, down to 60 this AM 2. Given presenting sepsis, suspect developing DIC related to COVID infection 3. Follow CBC in AM 8. End of life 1. Called patient's son to update on patient's condition. Family is in agreement with current plan of care and is aware of patient's very poor prognosis 2. Plan to continue current course with no plan for escalation of care. Pt is now DNR. If patient further decompensates or worsens, then would consider transition to full comfort at that time  DVT prophylaxis: Heparin subq Code Status: Was full code, now DNR Family Communication: Pt in room, family over phone Disposition Plan: Uncertain at this time  Consultants:   PCCM  Procedures:     Antimicrobials: Anti-infectives (From admission, onward)   Start     Dose/Rate Route Frequency Ordered Stop   04/30/19 1000  remdesivir 100 mg in sodium chloride 0.9 % 100 mL IVPB     100 mg 200 mL/hr over 30 Minutes Intravenous Daily 04/29/19 2135 08-29-2019  1610   04/30/19 1000  cefTRIAXone (ROCEPHIN) 1 g in sodium chloride 0.9 % 100 mL IVPB     1 g 200 mL/hr over 30 Minutes Intravenous Every 24 hours 04/30/19 0903     04/29/19 2145  remdesivir 200 mg in sodium chloride 0.9% 250 mL IVPB     200 mg 580 mL/hr over 30 Minutes Intravenous Once 04/29/19 2135 04/29/19 2316       Subjective: Unable to assess given lethargy  Objective: Vitals:   04/30/19 1000 04/30/19 1011 04/30/19 1200 04/30/19 1400  BP: (!) 112/45  (!) 109/53 112/66  Pulse: (!) 117 (!) 119 86 (!) 112  Resp: (!) 38 (!) 36 (!) 35 (!) 30  Temp:   97.7 F (36.5 C)   TempSrc:   Oral   SpO2: (!) 88% 91% 94% 96%  Weight:      Height:        Intake/Output Summary (Last 24 hours) at 04/30/2019 1439 Last data filed at 04/30/2019 1400 Gross per 24 hour  Intake 2797.33 ml  Output --  Net 2797.33 ml   Filed Weights   04/30/19 0220  Weight: 67.4 kg    Examination:  General exam: Appears calm and comfortable, lethargic Respiratory system: Clear to auscultation. Increased respiratory effort normal. Cardiovascular system: S1 & S2 heard, Regular Gastrointestinal system: Abdomen is nondistended, soft and nontender. No organomegaly or masses felt. Normal bowel sounds heard. Central nervous system: Alert and oriented. No focal neurological deficits. Extremities: Symmetric 5 x 5 power. Skin: No rashes, lesions  Psychiatry: Unable to assess given current mentation  Data Reviewed: I have personally reviewed following labs and imaging studies  CBC: Recent Labs  Lab 04/29/19 1819 04/30/19 0614  WBC 14.1* 10.1  NEUTROABS 12.1* 9.0*  HGB 14.7 12.5  HCT 45.7 38.3  MCV 94.0 92.7  PLT 134* 60*   Basic Metabolic Panel: Recent Labs  Lab 04/29/19 1819 04/30/19 0614  NA 156* 154*  K 3.9 3.6  CL 119* 124*  CO2 18* 16*  GLUCOSE 169* 188*  BUN 81* 81*  CREATININE 4.00* 3.08*  CALCIUM 11.1* 9.7   GFR: Estimated Creatinine Clearance: 11.8 mL/min (A) (by C-G formula based on SCr of 3.08 mg/dL (H)). Liver Function Tests: Recent Labs  Lab 04/29/19 1819 04/30/19 0614  AST 36 28  ALT 25 21  ALKPHOS 97 78  BILITOT 0.6 0.4  PROT 8.8* 6.9  ALBUMIN 3.9 3.0*   No results for input(s): LIPASE, AMYLASE in the last 168 hours. Recent Labs  Lab 04/29/19 1819  AMMONIA 18   Coagulation  Profile: No results for input(s): INR, PROTIME in the last 168 hours. Cardiac Enzymes: No results for input(s): CKTOTAL, CKMB, CKMBINDEX, TROPONINI in the last 168 hours. BNP (last 3 results) No results for input(s): PROBNP in the last 8760 hours. HbA1C: No results for input(s): HGBA1C in the last 72 hours. CBG: Recent Labs  Lab 04/30/19 0240  GLUCAP 183*   Lipid Profile: No results for input(s): CHOL, HDL, LDLCALC, TRIG, CHOLHDL, LDLDIRECT in the last 72 hours. Thyroid Function Tests: No results for input(s): TSH, T4TOTAL, FREET4, T3FREE, THYROIDAB in the last 72 hours. Anemia Panel: No results for input(s): VITAMINB12, FOLATE, FERRITIN, TIBC, IRON, RETICCTPCT in the last 72 hours. Sepsis Labs: Recent Labs  Lab 04/29/19 2041 04/29/19 2244 04/30/19 1047 04/30/19 1324  LATICACIDVEN 2.3* 2.4* 1.6 1.6    Recent Results (from the past 240 hour(s))  Blood Cultures (routine x 2)     Status: None (  Preliminary result)   Collection Time: 04/29/19  6:19 PM   Specimen: BLOOD  Result Value Ref Range Status   Specimen Description BLOOD LEFT ANTECUBITAL  Final   Special Requests   Final    BOTTLES DRAWN AEROBIC AND ANAEROBIC Blood Culture adequate volume Performed at [facility], [address], [place], [place] [address]    Culture PENDING  Incomplete   Report Status PENDING  Incomplete  SARS CORONAVIRUS 2 (TAT 6-24 HRS) Nasopharyngeal Nasopharyngeal Swab     Status: Abnormal   Collection Time: 04/30/19  1:23 AM   Specimen: Nasopharyngeal Swab  Result Value Ref Range Status   SARS Coronavirus 2 POSITIVE (A) NEGATIVE Final    Comment: RESULT CALLED TO, READ BACK BY AND VERIFIED WITH: NUSTAFA KOUJAK RN.@[email or web address]  ON 1.31.21 BY TCALDWELL MT. (NOTE) SARS-CoV-2 target nucleic acids are DETECTED. The SARS-CoV-2 RNA is generally detectable in upper and lower respiratory specimens during the acute phase of infection. Positive results are indicative of the presence of  SARS-CoV-2 RNA. Clinical correlation with patient history and other diagnostic information is  necessary to determine patient infection status. Positive results do not rule out bacterial infection or co-infection with other viruses.  The expected result is Negative. Fact Sheet for Patients: 02-22-1997 Fact Sheet for Healthcare Providers: HairSlick.no This test is not yet approved or cleared by the quierodirigir.com FDA and  has been authorized for detection and/or diagnosis of SARS-CoV-2 by FDA under an Emergency Use Authorization (EUA). This EUA will remain  in effect (meaning this test can  be used) for the duration of the COVID-19 declaration under Section 564(b)(1) of the Act, 21 U.S.C. section 360bbb-3(b)(1), unless the authorization is terminated or revoked sooner. Performed at [facility], [address], [place], [place]   MRSA PCR Screening     Status: None   Collection Time: 04/30/19  4:04 AM   Specimen: Nasal Mucosa; Nasopharyngeal  Result Value Ref Range Status   MRSA by PCR NEGATIVE NEGATIVE Final    Comment:        The GeneXpert MRSA Assay (FDA approved for NASAL specimens only), is one component of a comprehensive MRSA colonization surveillance program. It is not intended to diagnose MRSA infection nor to guide or monitor treatment for MRSA infections. Performed at [facility], [address] [place], [place]      Radiology Studies: CT HEAD WO CONTRAST  Result Date: 04/29/2019 CLINICAL DATA:  Altered mental status. EXAM: CT HEAD WITHOUT CONTRAST TECHNIQUE: Contiguous axial images were obtained from the base of the skull through the vertex without intravenous contrast. COMPARISON:  May 04, 2016 FINDINGS: Brain: There is moderate severity cerebral atrophy with widening of the extra-axial spaces and ventricular dilatation. There are areas of decreased  attenuation within the white matter tracts of the supratentorial brain, consistent with microvascular disease changes. A small to moderate sized area of cortical encephalomalacia, with adjacent chronic white matter low attenuation, is seen within the left frontal lobe. This represents a new finding when compared to the prior study. Vascular: No hyperdense vessel or unexpected calcification. Skull: Normal. Negative for fracture or focal lesion. Sinuses/Orbits: There is very mild bilateral ethmoid sinus mucosal thickening. Other: None. IMPRESSION: 1. Generalized cerebral atrophy. 2. No acute intracranial abnormality. 3. Chronic left frontal lobe infarct. Electronically Signed   By: May 06, 2016 M.D.   On: 04/29/2019 18:47   DG Chest Port 1 View  Result Date: 04/29/2019 CLINICAL DATA:  Altered  mental status EXAM: PORTABLE CHEST 1 VIEW COMPARISON:  05/04/2016 FINDINGS: Patient is rotated. Heart size within normal limits. Calcific aortic knob. No focal airspace consolidation. No pleural effusion or pneumothorax. No acute osseous findings. IMPRESSION: No acute cardiopulmonary findings. Electronically Signed   By: Davina Poke D.O.   On: 04/29/2019 18:44   VAS [place] LOWER EXTREMITY VENOUS (DVT)  Result Date: 04/30/2019  Lower Venous Study Indications: Elevated Ddimer.  Risk Factors: COVID 19 positive. Limitations: Poor ultrasound/tissue interface and patient positioning, patient immobility. Comparison Study: No prior studies. Performing Technologist: Oliver Hum RVT  Examination Guidelines: A complete evaluation includes B-mode imaging, spectral Doppler, color Doppler, and power Doppler as needed of all accessible portions of each vessel. Bilateral testing is considered an integral part of a complete examination. Limited examinations for reoccurring indications may be performed as noted.  +---------+---------------+---------+-----------+----------+--------------+ RIGHT     CompressibilityPhasicitySpontaneityPropertiesThrombus Aging +---------+---------------+---------+-----------+----------+--------------+ CFV      Full           Yes      Yes                                 +---------+---------------+---------+-----------+----------+--------------+ SFJ      Full                                                        +---------+---------------+---------+-----------+----------+--------------+ FV Prox  Full                                                        +---------+---------------+---------+-----------+----------+--------------+ FV Mid   Full                                                        +---------+---------------+---------+-----------+----------+--------------+ FV DistalFull                                                        +---------+---------------+---------+-----------+----------+--------------+ PFV      Full                                                        +---------+---------------+---------+-----------+----------+--------------+ POP      Full           Yes      Yes                                 +---------+---------------+---------+-----------+----------+--------------+ PTV      Full                                                        +---------+---------------+---------+-----------+----------+--------------+  PERO     Full                                                        +---------+---------------+---------+-----------+----------+--------------+   +---------+---------------+---------+-----------+----------+--------------+ LEFT     CompressibilityPhasicitySpontaneityPropertiesThrombus Aging +---------+---------------+---------+-----------+----------+--------------+ CFV      Full           Yes      Yes                                 +---------+---------------+---------+-----------+----------+--------------+ SFJ      Full                                                         +---------+---------------+---------+-----------+----------+--------------+ FV Prox  Full                                                        +---------+---------------+---------+-----------+----------+--------------+ FV Mid   Full                                                        +---------+---------------+---------+-----------+----------+--------------+ FV DistalNone           No       No                   Acute          +---------+---------------+---------+-----------+----------+--------------+ PFV      Full                                                        +---------+---------------+---------+-----------+----------+--------------+ POP      None           No       No                   Acute          +---------+---------------+---------+-----------+----------+--------------+ PTV                                                   Not visualized +---------+---------------+---------+-----------+----------+--------------+ PERO                                                  Not visualized +---------+---------------+---------+-----------+----------+--------------+     Summary: Right: There is no evidence of deep  vein thrombosis in the lower extremity. However, portions of this examination were limited- see technologist comments above. No cystic structure found in the popliteal fossa. Left: Findings consistent with acute deep vein thrombosis involving the left femoral vein, and left popliteal vein. No cystic structure found in the popliteal fossa.  *See table(s) above for measurements and observations.    Preliminary     Scheduled Meds: . vitamin C  500 mg Oral Daily  . Chlorhexidine Gluconate Cloth  6 each Topical Daily  . dexamethasone (DECADRON) injection  6 mg Intravenous QHS  . heparin injection (subcutaneous)  7,500 Units Subcutaneous Q8H  . zinc sulfate  220 mg Oral Daily   Continuous Infusions: . sodium chloride 100 mL/hr  at 04/30/19 1400  . cefTRIAXone (ROCEPHIN)  IV Stopped (04/30/19 1044)  . remdesivir 100 mg in NS 100 mL 100 mg (04/30/19 1242)     LOS: 1 day   Rickey Barbara, MD [facility] Pager On Amion  If 7PM-7AM, please contact night-coverage 04/30/2019, 2:39 PM

## 2019-04-30 NOTE — Consult Note (Signed)
NAME:  Renee Kramer, MRN:  [id number], DOB:  06-13-1928, LOS: 1 ADMISSION DATE:  04/29/2019, CONSULTATION DATE: 04/30/2019 REFERRING MD:  Dr. Rhona Leavens, [facility], CHIEF COMPLAINT:  Hypoxia   Brief History   86 yo female with hx of Alzheimer's type dementia residing at [facility] and was diagnosed with COVID 19 infection about 6 days prior to admission was transferred to ER due to worsening mental status.  Found to have hypernatremia, metabolic acidosis, acute renal failure, possible UTI.  Developed progressive hypoxia requiring NRB.  PCCM asked to assist with management.  Hx from chart and medical staff.  Patient not able to provide hx.  Past Medical History  Alzheimer's type dementia, Osteoporosis, Allergies, Vit D deficiency, Hypertension  Significant Hospital Events   1/30 Admit 1/31 Family discussion >> continue current therapies, DNR/DNI, comfort measures if she gets worse  Consults:    Procedures:    Significant Diagnostic Tests:  CT head 1/30 >> mod to severe cerebral atrophy, microvascular disease changes, cortical encephalomalacia in Lt frontal lobe  Micro Data:  Blood 1/30 >>  SARS CoV2 Ag 1/30 >> Positive SARS CoV2 TAT 1/31 >> Positive Urine  COVID Therapy:  Decadron 1/30 >> Remdesivir 1/30 >>  Antimicrobials:  Rocephin 1/30 >>   Interim history/subjective:    Objective   Blood pressure (!) 112/45, pulse (!) 119, temperature 98.6 F (37 C), temperature source Oral, resp. rate (!) 36, height 5\' 6"  (1.676 m), weight 67.4 kg, SpO2 91 %.        Intake/Output Summary (Last 24 hours) at 04/30/2019 1112 Last data filed at 04/30/2019 0600 Gross per 24 hour  Intake 1997.33 ml  Output --  Net 1997.33 ml   Filed Weights   04/30/19 0220  Weight: 67.4 kg    Examination:  General - warming blanket on Eyes - pupils midpoint and weakly reactive ENT - no sinus tenderness, no stridor Cardiac - regular, tachycardic, no murmur Chest - faint b/l rales, using accessory  muscles Abdomen - soft, non tender, decreased bowel sounds Extremities - no cyanosis, clubbing, or edema Skin - no rashes Neuro - grimaces with stimulation, non verbal, not following commands GU - no lesions noted   Resolved Hospital Problem list     Assessment & Plan:   Acute hypoxic respiratory failure from COVID 19 pneumonia. - oxygen to keep SpO2 85 to 95% as able - day 2 of remdesivir and decadron - DNR/DNI   Possible UTI. - continue rocephin  Dementia. - monitor mental status  Hypernatremia. AKI. Non gap metabolic acidosis. - continue IV fluids  Thrombocytopenia. - f/u CBC  Goals of care. - spoke with pt's son over the phone - he is in agreement to continue current therapies, but not escalate care - DNR/DNI - if she gets worse then transition to comfort measures  Best practice:  Diet: NPO DVT prophylaxis: SCDs GI prophylaxis: Not indicated Mobility: bed rest Code Status: DNR/DNI Disposition: ICU  Labs   CBC: Recent Labs  Lab 04/29/19 1819 04/30/19 0614  WBC 14.1* 10.1  NEUTROABS 12.1* 9.0*  HGB 14.7 12.5  HCT 45.7 38.3  MCV 94.0 92.7  PLT 134* 60*    Basic Metabolic Panel: Recent Labs  Lab 04/29/19 1819 04/30/19 0614  NA 156* 154*  K 3.9 3.6  CL 119* 124*  CO2 18* 16*  GLUCOSE 169* 188*  BUN 81* 81*  CREATININE 4.00* 3.08*  CALCIUM 11.1* 9.7   GFR: Estimated Creatinine Clearance: 11.8 mL/min (A) (by C-G formula based  on SCr of 3.08 mg/dL (H)). Recent Labs  Lab 04/29/19 1819 04/29/19 2041 04/29/19 2244 04/30/19 0614  WBC 14.1*  --   --  10.1  LATICACIDVEN  --  2.3* 2.4*  --     Liver Function Tests: Recent Labs  Lab 04/29/19 1819 04/30/19 0614  AST 36 28  ALT 25 21  ALKPHOS 97 78  BILITOT 0.6 0.4  PROT 8.8* 6.9  ALBUMIN 3.9 3.0*   No results for input(s): LIPASE, AMYLASE in the last 168 hours. Recent Labs  Lab 04/29/19 1819  AMMONIA 18    ABG    Component Value Date/Time   PHART 7.407 04/30/2019 0318    PCO2ART 27.0 (L) 04/30/2019 0318   PO2ART 67.8 (L) 04/30/2019 0318   HCO3 16.9 (L) 04/30/2019 0318   TCO2 22 04/10/2016 2336   ACIDBASEDEF 5.7 (H) 04/30/2019 0318   O2SAT 93.2 04/30/2019 0318     Coagulation Profile: No results for input(s): INR, PROTIME in the last 168 hours.  Cardiac Enzymes: No results for input(s): CKTOTAL, CKMB, CKMBINDEX, TROPONINI in the last 168 hours.  HbA1C: No results found for: HGBA1C  CBG: Recent Labs  Lab 04/30/19 0240  GLUCAP 183*    Review of Systems:   Unable to obtain  Past Medical History  She,  has a past medical history of Alzheimer disease ([place]) and Dementia ([place]).   Surgical History   History reviewed. No pertinent surgical history.   Social History   reports that she has never smoked. She has never used smokeless tobacco. She reports that she does not drink alcohol or use drugs.   Family History   Her family history is not on file.   Allergies No Known Allergies   Home Medications  Prior to Admission medications   Medication Sig Start Date End Date Taking? Authorizing Provider  acetaminophen (TYLENOL) 325 MG tablet Take 325 mg by mouth 2 (two) times daily.     [provider]  acetaminophen (TYLENOL) 500 MG tablet Take 500 mg by mouth every 4 (four) hours as needed for moderate pain, fever or headache.    [provider]  alendronate (FOSAMAX) 35 MG tablet Take 35 mg by mouth every Thursday.     [provider]  alum & mag hydroxide-simeth ([place]) 200-200-20 MG/5ML suspension Take 30 mLs by mouth every 6 (six) hours as needed for indigestion or heartburn.    [provider]  calcium carbonate (OS-CAL - DOSED IN MG OF ELEMENTAL CALCIUM) 1250 (500 Ca) MG tablet Take 1 tablet by mouth 2 (two) times daily.    [provider]  cetirizine (ZYRTEC) 10 MG tablet Take 10 mg by mouth at bedtime.    [provider]  cholecalciferol (VITAMIN D) 1000 UNITS tablet Take 1,000 Units by  mouth daily with breakfast.     [provider]  clobetasol ointment (TEMOVATE) 1.44 % Apply 1 application topically every 6 (six) hours as needed (rash).     [provider]  Cranberry 450 MG TABS Take 450 mg by mouth daily with breakfast.     [provider]  donepezil (ARICEPT) 10 MG tablet Take 10 mg by mouth at bedtime.    [provider]  fluticasone (FLONASE) 50 MCG/ACT nasal spray Place 2 sprays into both nostrils daily.    [provider]  guaifenesin (ROBAFEN) 100 MG/5ML syrup Take 200 mg by mouth every 6 (six) hours as needed for cough.    [provider]  lisinopril (  PRINIVIL,ZESTRIL) 5 MG tablet Take 5 mg by mouth every morning.    [provider]  loperamide (IMODIUM) 2 MG capsule Take 2 mg by mouth as needed for diarrhea or loose stools.    [provider]  magnesium hydroxide (MILK OF MAGNESIA) 400 MG/5ML suspension Take 30 mLs by mouth at bedtime as needed for mild constipation.    [provider]  megestrol (MEGACE) 400 MG/10ML suspension Take 400 mg by mouth 2 (two) times daily.    [provider]  Melatonin 1 MG TABS Take 2 mg by mouth at bedtime.     [provider]  Multiple Vitamin (TAB-A-VITE) TABS Take 1 tablet by mouth daily with breakfast.    [provider]  Neomycin-Bacitracin-Polymyxin (TRIPLE ANTIBIOTIC) [phone] OINT Apply 1 application topically as needed.    [provider]  PRESCRIPTION MEDICATION Take 118 mLs by mouth 3 (three) times daily. Mighty shakes    [provider]  senna (SENOKOT) 8.6 MG tablet Take 2 tablets by mouth every morning.     [provider]  sertraline (ZOLOFT) 50 MG tablet Take 50 mg by mouth every morning.    [provider]     D/w Dr. Rhona Leavens  PCCM will sign off.  Please call if additional help needed while she is in hospital.  Coralyn Helling, MD [facility] Pulmonary/Critical Care 04/30/2019,  11:30 AM

## 2019-04-30 NOTE — Progress Notes (Addendum)
Upon assessment patient is not arousable.  She does respond to pain but she will not open her eyes to voice, shouting, or sternal rub.  Upon sternal rub her HR spiked into the 130's and then collapsed back into the low 110's.  No advantageous breath sounds can be heard upon ausculation.  I cannot hear her belly sounds d/t negative pressure equipment noise.  Her peripheral pulses are weak but present.  Capillary refill is less than 3 seconds.  She is tachypnea upon assessment RR 31 saturating around 88%.  Changed [place] from 2-3L.  She is mouth breathing. Her B/P 104/51 (63).  I coordinated with physician and Pharmacy and lab concerning her Covid positive status per her admission arrival to ED.  Her rapid in the ED was negative so a send out was placed this morning around 0130.  D/T patients creatinine and GFR, I am holding the remdesivir so that I don't cause anymore harm to her kidneys.  If her test comes back COVID positive, I will administer remdesivir medicine at that time.  For now we will be treating her with Rocephin IV.  If she becomes alert, I will administer the ascorbic acid and zinc.

## 2019-04-30 NOTE — Progress Notes (Signed)
Bilateral lower extremity venous duplex has been completed. Preliminary results can be found in CV Proc through chart review.  Results were given to the patient's nurse, [facility].  04/30/19 12:41 PM Olen Cordial RVT

## 2019-04-30 NOTE — Plan of Care (Signed)
  Problem: Education: Goal: Knowledge of General Education information will improve Description: Including pain rating scale, medication(s)/side effects and non-pharmacologic comfort measures 04/30/2019 0636 by Verne Grain, RN Outcome: Not Progressing  Problem: Health Behavior/Discharge Planning: Goal: Ability to manage health-related needs will improve 04/30/2019 0636 by Verne Grain, RN Outcome: Not Progressing Problem: Clinical Measurements: Goal: Ability to maintain clinical measurements within normal limits will improve Outcome: Not Progressing Goal: Will remain free from infection Outcome: Not Progressing Goal: Diagnostic test results will improve Outcome: Not Progressing Goal: Respiratory complications will improve 04/30/2019 0636 by Verne Grain, RN Outcome: Not Progressing 04/30/2019 0635 by Verne Grain, RN Outcome: Not Progressing Goal: Cardiovascular complication will be avoided Outcome: Not Progressing

## 2019-04-30 NOTE — Progress Notes (Addendum)
Pt came to floor from ED only responding to pain and cool to the touch. Rectal T was 96.4. RR called and On-call notified. This nurse verified with SNF that pt is a Full Code.Pt CBG 183.  Pt transferred to ICU. Son Tammy Sours updated via telephone.

## 2019-04-30 NOTE — ED Notes (Signed)
Patient's covid results from [facility]  04/11/19 are negative. Patient received first covid vaccine dose 04/21/19. Will bring paperwork up to room with patient.

## 2019-04-30 NOTE — Patient Care Conference (Signed)
Called and updated patient's son. Family now aware of lower extremity DVT and being unable to fully anticoagulate given thrombocytopenia. Will follow

## 2019-04-30 NOTE — Progress Notes (Signed)
Rapid Response Event Note  Overview: Called d/t being lethargic and hypothermic   Initial Focused Assessment: pt found to be very hard to arouse, however holding her body stiff and squinting her eyes tight when touched.  Pt does acknowledge her name being called briefly.  See VS in flow sheet.  Breath sounds decreased.   Interventions: VS per monitor, will place pt on bair huggar d/t low temperature.  ABG to be completed.  Labs placed for follow up.  Plan of Care (if not transferred): Pt to go to SD unit for closer monitoring for [facility], NP   Conley Rolls

## 2019-05-01 ENCOUNTER — Inpatient Hospital Stay (HOSPITAL_COMMUNITY): Payer: Medicare FFS

## 2019-05-01 DIAGNOSIS — F0390 Unspecified dementia without behavioral disturbance: Secondary | ICD-10-CM

## 2019-05-01 DIAGNOSIS — I6389 Other cerebral infarction: Secondary | ICD-10-CM

## 2019-05-01 LAB — COMPREHENSIVE METABOLIC PANEL
ALT: 20 U/L (ref 0–44)
AST: 28 U/L (ref 15–41)
Albumin: 2.8 g/dL — ABNORMAL LOW (ref 3.5–5.0)
Alkaline Phosphatase: 76 U/L (ref 38–126)
Anion gap: 15 (ref 5–15)
BUN: 95 mg/dL — ABNORMAL HIGH (ref 8–23)
CO2: 16 mmol/L — ABNORMAL LOW (ref 22–32)
Calcium: 8.7 mg/dL — ABNORMAL LOW (ref 8.9–10.3)
Chloride: 128 mmol/L — ABNORMAL HIGH (ref 98–111)
Creatinine, Ser: 2.64 mg/dL — ABNORMAL HIGH (ref 0.44–1.00)
GFR calc Af Amer: 18 mL/min — ABNORMAL LOW (ref >60)
GFR calc non Af Amer: 16 mL/min — ABNORMAL LOW (ref >60)
Glucose, Bld: 166 mg/dL — ABNORMAL HIGH (ref 70–99)
Potassium: 3.6 mmol/L (ref 3.5–5.1)
Sodium: 159 mmol/L — ABNORMAL HIGH (ref 135–145)
Total Bilirubin: 0.4 mg/dL (ref 0.3–1.2)
Total Protein: 6.8 g/dL (ref 6.5–8.1)

## 2019-05-01 LAB — CBC WITH DIFFERENTIAL/PLATELET
Abs Immature Granulocytes: 0.07 10*3/uL (ref 0.00–0.07)
Basophils Absolute: 0 10*3/uL (ref 0.0–0.1)
Basophils Relative: 0 %
Eosinophils Absolute: 0 10*3/uL (ref 0.0–0.5)
Eosinophils Relative: 0 %
HCT: 40.8 % (ref 36.0–46.0)
Hemoglobin: 12.9 g/dL (ref 12.0–15.0)
Immature Granulocytes: 1 %
Lymphocytes Relative: 4 %
Lymphs Abs: 0.5 10*3/uL — ABNORMAL LOW (ref 0.7–4.0)
MCH: 29.9 pg (ref 26.0–34.0)
MCHC: 31.6 g/dL (ref 30.0–36.0)
MCV: 94.4 fL (ref 80.0–100.0)
Monocytes Absolute: 0.6 10*3/uL (ref 0.1–1.0)
Monocytes Relative: 4 %
Neutro Abs: 13.8 10*3/uL — ABNORMAL HIGH (ref 1.7–7.7)
Neutrophils Relative %: 91 %
Platelets: 68 10*3/uL — ABNORMAL LOW (ref 150–400)
RBC: 4.32 MIL/uL (ref 3.87–5.11)
RDW: 14.5 % (ref 11.5–15.5)
WBC: 15 10*3/uL — ABNORMAL HIGH (ref 4.0–10.5)
nRBC: 0 % (ref 0.0–0.2)

## 2019-05-01 LAB — ECHOCARDIOGRAM LIMITED
Height: 66 in
Weight: 2378.15 oz

## 2019-05-01 LAB — URINE CULTURE: Culture: <10000 — AB

## 2019-05-01 LAB — C-REACTIVE PROTEIN: CRP: 14.7 mg/dL — ABNORMAL HIGH (ref <1.0)

## 2019-05-01 LAB — HEPARIN LEVEL (UNFRACTIONATED): Heparin Unfractionated: 0.28 IU/mL — ABNORMAL LOW (ref 0.30–0.70)

## 2019-05-01 LAB — D-DIMER, QUANTITATIVE: D-Dimer, Quant: >20 ug/mL-FEU — ABNORMAL HIGH (ref 0.00–0.50)

## 2019-05-01 MED ORDER — STROKE: EARLY STAGES OF RECOVERY BOOK
Freq: Once | Status: AC
  Filled 2019-05-01: qty 1

## 2019-05-01 MED ORDER — HEPARIN (PORCINE) 25000 UT/250ML-% IV SOLN
950.0000 [IU]/h | INTRAVENOUS | Status: DC
  Administered 2019-05-01 – 2019-05-02 (×2): 950 [IU]/h via INTRAVENOUS
  Filled 2019-05-01 (×3): qty 250

## 2019-05-01 MED ORDER — DEXTROSE 5 % IV SOLN
INTRAVENOUS | Status: DC
  Administered 2019-05-01: 16:00:00 1000 mL via INTRAVENOUS

## 2019-05-01 NOTE — Progress Notes (Addendum)
Patient trasfered from [facility] to [phone] via [facility]; drowsy and responding to pain; unable to assess her orientation; patient will not answer questions, will not participate in assessment. Foley cath in place;  IV saline locked in LAC running Heparin at 9.5 ml/hr. New IV started in right hand with fluids running at 100 ml/hr per order. Skin - MASD groin area and buttocks; scratches in buttocks; discoloration BLE; scab RLE .Fall risk precautions initiated; Tele monitor on per order. MEWS score 2 - MD aware. This is not an acute change in patient's condition. Will continue to monitor the patient.

## 2019-05-01 NOTE — Consult Note (Signed)
Neurology Consultation  Reason for Consult: Embolic strokes  Referring Physician: Dr. Rhona Leavens  CC: Embolic strokes  History is obtained from: Chart  HPI: Renee Kramer is an 86 y.o. female with a history of dementia, Alzheimer's dementia who was reported positive for COVID-19 by her nursing home approximately 6 days ago.  Because of altered mental status and worsening of weakness along with decreased p.o. intake the patient was brought to the hospital.  She has AKI with creatinine increased to 4.  Due to an abnormal MRI showing bilateral acute infarcts consistent with embolic shower Neurology was asked to see the patient.  While in the hospital the patient was found to have a left lower extremity DVT, severe sepsis with Covid pneumonia and UTI.  Acute renal failure was suspected secondary to dehydration. She was initially hyponatremic and is now hypernatremic. Also diagnosed with failure to thrive and thrombocytopenia. MRI obtained today showed bilateral acute infarcts.    Due to bilateral acute infarcts the patient was transferred to [facility]. She is on the Covid unit for further treatment of sepsis and metabolic issues.  Work up that has been done: CT head which showed generalized cerebral atrophy MRI brain which showed multiple areas of acute infarct with pattern suggestive of multiple emboli CMP showing sodium of 159, BUN 95, creatinine 2.64, albumin 2.8 CBC showing white blood cell count of 15 and platelets of 68 down from 134 two days ago  LKW: Unknown tpa given?: no, out of window Premorbid modified Rankin scale (mRS): 4 NIH stroke score: 31   Past Medical History:  Diagnosis Date  . Alzheimer disease (HCC)   . Dementia (HCC)    History reviewed.  Unable to obtain family history secondary to patient being obtunded  Social History:   reports that she has never smoked. She has never used smokeless tobacco. She reports that she does not drink alcohol or use  drugs.  Medications  Current Facility-Administered Medications:  .  acetaminophen (TYLENOL) tablet 650 mg, 650 mg, Oral, Q6H PRN, Haydee Monica, MD .  ascorbic acid (VITAMIN C) tablet 500 mg, 500 mg, Oral, Daily, Tarry Kos A, MD, 500 mg at 04/29/19 2245 .  cefTRIAXone (ROCEPHIN) 1 g in sodium chloride 0.9 % 100 mL IVPB, 1 g, Intravenous, Q24H, Jerald Kief, MD, Stopped at 05/01/19 0935 .  chlorhexidine (PERIDEX) 0.12 % solution 15 mL, 15 mL, Mouth Rinse, BID, Jerald Kief, MD, 15 mL at 05/01/19 0906 .  Chlorhexidine Gluconate Cloth 2 % PADS 6 each, 6 each, Topical, Daily, Jerald Kief, MD, 6 each at 05/01/19 [phone] .  dexamethasone (DECADRON) injection 6 mg, 6 mg, Intravenous, QHS, Tarry Kos A, MD, 6 mg at 04/30/19 2207 .  dextrose 5 % solution, , Intravenous, Continuous, Jerald Kief, MD, Last Rate: 100 mL/hr at 05/01/19 1428, Rate Verify at 05/01/19 1428 .  heparin ADULT infusion 100 units/mL (25000 units/280mL sodium chloride 0.45%), 950 Units/hr, Intravenous, Continuous, BellVeatrice Bourbon, RPH, Last Rate: 9.5 mL/hr at 05/01/19 1428, 950 Units/hr at 05/01/19 1428 .  MEDLINE mouth rinse, 15 mL, Mouth Rinse, q12n4p, Jerald Kief, MD, 15 mL at 05/01/19 1109 .  metoprolol tartrate (LOPRESSOR) injection 2.5 mg, 2.5 mg, Intravenous, Q6H, Jerald Kief, MD, 2.5 mg at 05/01/19 1111 .  ondansetron (ZOFRAN) tablet 4 mg, 4 mg, Oral, Q6H PRN **OR** ondansetron (ZOFRAN) injection 4 mg, 4 mg, Intravenous, Q6H PRN, Haydee Monica, MD .  [COMPLETED] remdesivir 200 mg in sodium chloride 0.9%  250 mL IVPB, 200 mg, Intravenous, Once, Stopped at 04/29/19 2316 **FOLLOWED BY** remdesivir 100 mg in sodium chloride 0.9 % 100 mL IVPB, 100 mg, Intravenous, Daily, Phillips Grout, MD, Stopped at 05/01/19 1001 .  zinc sulfate capsule 220 mg, 220 mg, Oral, Daily, Derrill Kay A, MD, 220 mg at 04/29/19 2245  ROS: Unable to obtain secondary to patient being obtunded   Exam: Current vital  signs: BP (!) 147/58   Pulse 78   Temp (!) 97 F (36.1 C)   Resp 19   Ht 5\' 6"  (1.676 m)   Wt 67.4 kg   SpO2 100%   BMI 23.99 kg/m  Vital signs in last 24 hours: Temp:  [96.8 F (36 C)-98.2 F (36.8 C)] 97 F (36.1 C) (02/01 1400) Pulse Rate:  [72-114] 78 (02/01 1400) Resp:  [11-30] 19 (02/01 1400) BP: (101-153)/(36-78) 147/58 (02/01 1400) SpO2:  [92 %-100 %] 100 % (02/01 1400)   Constitutional: Appears well-developed and well-nourished.  Eyes: No scleral injection HENT: No OP obstrucion Head: Normocephalic.  Cardiovascular: Normal rate and regular rhythm.  Respiratory: Effort normal, non-labored breathing GI: Soft.  No distension. There is no tenderness.  Skin: WDI  Neuro: Mental Status: Patient is in bed, head tilted to the left, localizes noxious stimuli with her left arm.  Does not follow commands. Moans only to verbal and tactile stimuli.  Cranial Nerves: II: No blink to threat III,IV, VI: PERRL, crosses midline with doll's eyes, left gaze preference V: Moans to bilateral noxious stimuli VII: Right facial droop that is slight VIII: No response to voice Motor: Patient moves and localizes with left arm with 4/5 strength, withdraws from pain with 5/5 strength in lower extremities on the left.  Withdraws from pain with the right lower extremity antigravity only and less briskly than on the left.  RUE is flaccid Sensory: Moans and withdraws to noxious stimuli LUE and BLE. No response to noxious applied to RUE.  Deep Tendon Reflexes: 2+ and symmetric reflexes in upper extremities and patellae Plantars: Toes are downgoing bilaterally.  Cerebellar: Unable to obtain  Labs I have reviewed labs in epic and the results pertinent to this consultation are:   CBC    Component Value Date/Time   WBC 15.0 (H) 05/01/2019 0341   RBC 4.32 05/01/2019 0341   HGB 12.9 05/01/2019 0341   HCT 40.8 05/01/2019 0341   PLT 68 (L) 05/01/2019 0341   MCV 94.4 05/01/2019 0341   MCH  29.9 05/01/2019 0341   MCHC 31.6 05/01/2019 0341   RDW 14.5 05/01/2019 0341   LYMPHSABS 0.5 (L) 05/01/2019 0341   MONOABS 0.6 05/01/2019 0341   EOSABS 0.0 05/01/2019 0341   BASOSABS 0.0 05/01/2019 0341    CMP     Component Value Date/Time   NA 159 (H) 05/01/2019 0341   K 3.6 05/01/2019 0341   CL 128 (H) 05/01/2019 0341   CO2 16 (L) 05/01/2019 0341   GLUCOSE 166 (H) 05/01/2019 0341   BUN 95 (H) 05/01/2019 0341   CREATININE 2.64 (H) 05/01/2019 0341   CALCIUM 8.7 (L) 05/01/2019 0341   PROT 6.8 05/01/2019 0341   ALBUMIN 2.8 (L) 05/01/2019 0341   AST 28 05/01/2019 0341   ALT 20 05/01/2019 0341   ALKPHOS 76 05/01/2019 0341   BILITOT 0.4 05/01/2019 0341   GFRNONAA 16 (L) 05/01/2019 0341   GFRAA 18 (L) 05/01/2019 0341    Lipid Panel  No results found for: CHOL, TRIG, HDL, CHOLHDL, VLDL, LDLCALC, LDLDIRECT  Imaging I have reviewed the images obtained:  CT-scan of the brain 1. Generalized cerebral atrophy. 2. No acute intracranial abnormality. 3. Chronic left frontal lobe infarct.   MRI examination of the brain Multiple areas of acute infarct. Pattern suggestive of multiple emboli.  Advanced atrophy. Chronic ischemic changes in the white matter and left frontal lobe.   Felicie Morn PA-C [facility] [phone] 05/01/2019, 4:19 PM     Assessment: 86 year old female suffering from AMS in the setting of sepsis, UTI and AKI. MRI reveals bilateral acute ischemic strokes which appear embolic in nature. 1. MRI brain: Multiple areas of acute infarction. Acute infarct in the cerebellum bilaterally. Acute infarct in the right tectum. Acute infarct in the left occipital pole. Small areas of acute infarct in the left frontal lobe and in the left parietal lobe.  2. Patient on exam only shows moaning to noxious stimuli, withdrawing from noxious stimuli in the left upper extremity and bilateral lower extremities. Also with flaccid RUE and left gaze preference. 3. Most  likely component of the DDx consists of cardioembolic strokes, with possible etiologies including intermittent atrial fibrillation and cardiac mural thrombus or vegetation. Also relatively likely would be paradoxical embolization via possible PFO in the setting of known left lower extremity DVT.  4. Other medical conditions: Thrombocytopenia, hypernatremia, failure to thrive.     Recommendations: -MRA Head -Carotid ultrasound  -Transthoracic Echo and possibly TEE -Heparin per pharmacy and primary team. Per pharmacy consult, the patient is on a low-goal heparin dosing regimen with level 0.3 - 0.5 -BP goal: Normotensive. Age of the acute infarcts on MRI is most likely > 24 hours -HBAIC and Lipid profile -Telemetry monitoring -Frequent neuro checks -NPO until passes stroke swallow screen -PT/OT -Repeat CT in the a.m.  --Please page stroke NP  Or  PA  Or MD from 8am -4 pm  as this patient from this time will be  followed by the stroke.   You can look them up on [email or web address]  Password [email or web address]  I have seen and examined the patient. I have formulated the assessment and plan. 86 year old female with multiple strokes, most likely cardioembolic. Diagnostic and treatment recommendations as above.  Electronically signed: Dr. Caryl Pina

## 2019-05-01 NOTE — Progress Notes (Signed)
PROGRESS NOTE    Renee Kramer  XBD:[id number] DOB: December 09, 1928 DOA: 04/29/2019 PCP: Patient, No Pcp Per    Brief Narrative:  86 y.o. female with medical history significant of advanced dementia reportedly Covid positive by nursing home 6 days ago but cannot find confirmation of this sent in because of altered mental status and worsening weakness and decreased p.o. intake.  All history is obtained from emergency room staff and minimal records from the nursing home patient found to be in acute kidney injury with a creatinine bump up to 4.  Hyponatremic and severely dehydrated.  O2 sats are normal chest x-ray is negative.  Patient is been given normal saline 1 L IV fluids.  Patient be referred for admission for acute kidney injury in the setting of reported Covid infection  Assessment & Plan:   Principal Problem:   Sepsis due to COVID-19 [facility]) Active Problems:   AKI (acute kidney injury) (HCC)   FTT (failure to thrive) in adult   Dehydration   Hypernatremia   Dementia (HCC)   Thrombocytopenia (HCC)   DNR (do not resuscitate) discussion  1. Severe sepsis with COVID pneumonia and UTI 1. Testing confirms COVID + 2. Pt currently receiving remdesivir and decadron, anticipate total 5 day and 10 day course, respectively 3. UA suggestive of UTI. Continued on empiric rocephin. Urine cx pending results 4. CRP is elevated at 14.9, trend down to 14.7 5. Had required 15L high flow O2, now weaned to [facility] this AM 6. Cont vit c and zinc 2. ARF 1. Suspect secondary to dehydration 2. Cr improving with IVF hydration 3. Will continue IVF as tolerated 4. Repeat bmet in AM 3. Dehydration 1. Per above, would continue IVF hydration as tolerated 4. Failure to thrive 1. Currently NPO given lethargy 2. SLP consulted given acute CVA, see below 3. Continue hydration as tolerated 5. Hypernatremia 1. Suspect related to above dehydration 2. Worsening despite NS overnight. Will change to d5 IVF at same  rate 3. Repeat BMET in AM 6. LLE DVT 1. Elevated d-dimer markedly elevated at time of presentation at >20 2. LE dopplers pos for LLE DVT 3. Suspicion for PE remains high. Given thrombocytopenia, did not anticoagulate fully and instead, pt was placed on higher dose of subq heparin 4. Did not order CTA chest given renal failure 5. Plts now higher at 68k. Discussed with Hematology on call who recommends starting therapeutic heparin gtt with eventual transition to oral anticoagulant if patient recovers and plts normalize 6. Have updated family who is aware of initiating heparin gtt 7. Thrombocytopenia 1. Presenting plts of 134, down to 60 on 1/31 2. Given presenting sepsis, suspect developing DIC related to COVID infection 3. Plts now up to 68k with no evidence of bleeding 4. Will need to follow plt trends closely as pt is now anticoagulated on heparin 8. Acute CVA 1. New finding today 2. Pt is more alert. However, pt is noted to be flaccid on R 3. Initial head CT was neg 4. Ordered and reviewed STAT MRI brain. Findings of multiple areas of acute infarct suggestive of multiple emboli 5. 2d echo and carotid dopplers ordered 6. Have ordered a1c and lipid panel 7. Discussed with Neurology. OK for therapeutic anticoagulation per recommendations, see above. 8. Pt to be followed by Stroke Team on transfer to [facility] 9. End of life 1. Patient's son has been updated on patient's course thus far 2. Plan to continue current course with no plan for escalation of care. Pt is  currently DNR. If patient further decompensates or worsens, then would consider transition to full comfort at that time  DVT prophylaxis: Heparin subq Code Status: DNR Family Communication: Pt in room, family over phone on 2/1 Disposition Plan:Transfer to [facility] Consultants:   PCCM  Neurology  Discussed case with Hematology  Procedures:     Antimicrobials: Anti-infectives (From admission, onward)   Start     Dose/Rate Route  Frequency Ordered Stop   04/30/19 1000  remdesivir 100 mg in sodium chloride 0.9 % 100 mL IVPB     100 mg 200 mL/hr over 30 Minutes Intravenous Daily 04/29/19 2135 2019/05/15 0959   04/30/19 1000  cefTRIAXone (ROCEPHIN) 1 g in sodium chloride 0.9 % 100 mL IVPB     1 g 200 mL/hr over 30 Minutes Intravenous Every 24 hours 04/30/19 0903     04/29/19 2145  remdesivir 200 mg in sodium chloride 0.9% 250 mL IVPB     200 mg 580 mL/hr over 30 Minutes Intravenous Once 04/29/19 2135 04/29/19 2316      Subjective: Unable to assess given current mentation  Objective: Vitals:   05/01/19 1119 05/01/19 1125 05/01/19 1147 05/01/19 1200  BP:    (!) 120/57  Pulse: 78 77 78 77  Resp: (!) 22 (!) 22 (!) 25 (!) 21  Temp: (!) 96.8 F (36 C) (!) 97 F (36.1 C) (!) 97.3 F (36.3 C) (!) 97.2 F (36.2 C)  TempSrc: Bladder  Bladder Bladder  SpO2: 100% 99% 100% 99%  Weight:      Height:        Intake/Output Summary (Last 24 hours) at 05/01/2019 1207 Last data filed at 05/01/2019 1112 Gross per 24 hour  Intake 2484.7 ml  Output 710 ml  Net 1774.7 ml   Filed Weights   04/30/19 0220  Weight: 67.4 kg    Examination: General exam: Lethargic but seems more arousable than yesterday, laying in bed, in nad Respiratory system: Normal respiratory effort, no wheezing Cardiovascular system: regular rate, s1, s2 Gastrointestinal system: Soft, nondistended, positive BS Central nervous system: CN2-12 grossly intact, R arm flaccid Extremities: Perfused, no clubbing Skin: Normal skin turgor, no notable skin lesions seen Psychiatry: Unable to assess given mentation   Data Reviewed: I have personally reviewed following labs and imaging studies  CBC: Recent Labs  Lab 04/29/19 1819 04/30/19 0614 05/01/19 0341  WBC 14.1* 10.1 15.0*  NEUTROABS 12.1* 9.0* 13.8*  HGB 14.7 12.5 12.9  HCT 45.7 38.3 40.8  MCV 94.0 92.7 94.4  PLT 134* 60* 68*   Basic Metabolic Panel: Recent Labs  Lab 04/29/19 1819  04/30/19 0614 05/01/19 0341  NA 156* 154* 159*  K 3.9 3.6 3.6  CL 119* 124* 128*  CO2 18* 16* 16*  GLUCOSE 169* 188* 166*  BUN 81* 81* 95*  CREATININE 4.00* 3.08* 2.64*  CALCIUM 11.1* 9.7 8.7*   GFR: Estimated Creatinine Clearance: 13.8 mL/min (A) (by C-G formula based on SCr of 2.64 mg/dL (H)). Liver Function Tests: Recent Labs  Lab 04/29/19 1819 04/30/19 0614 05/01/19 0341  AST 36 28 28  ALT 25 21 20   ALKPHOS 97 78 76  BILITOT 0.6 0.4 0.4  PROT 8.8* 6.9 6.8  ALBUMIN 3.9 3.0* 2.8*   No results for input(s): LIPASE, AMYLASE in the last 168 hours. Recent Labs  Lab 04/29/19 1819  AMMONIA 18   Coagulation Profile: No results for input(s): INR, PROTIME in the last 168 hours. Cardiac Enzymes: No results for input(s): CKTOTAL, CKMB, CKMBINDEX, TROPONINI  in the last 168 hours. BNP (last 3 results) No results for input(s): PROBNP in the last 8760 hours. HbA1C: No results for input(s): HGBA1C in the last 72 hours. CBG: Recent Labs  Lab 04/30/19 0240  GLUCAP 183*   Lipid Profile: No results for input(s): CHOL, HDL, LDLCALC, TRIG, CHOLHDL, LDLDIRECT in the last 72 hours. Thyroid Function Tests: No results for input(s): TSH, T4TOTAL, FREET4, T3FREE, THYROIDAB in the last 72 hours. Anemia Panel: No results for input(s): VITAMINB12, FOLATE, FERRITIN, TIBC, IRON, RETICCTPCT in the last 72 hours. Sepsis Labs: Recent Labs  Lab 04/29/19 2041 04/29/19 2244 04/30/19 1047 04/30/19 1324  LATICACIDVEN 2.3* 2.4* 1.6 1.6    Recent Results (from the past 240 hour(s))  Blood Cultures (routine x 2)     Status: None (Preliminary result)   Collection Time: 04/29/19  6:19 PM   Specimen: BLOOD  Result Value Ref Range Status   Specimen Description BLOOD LEFT ANTECUBITAL  Final   Special Requests   Final    BOTTLES DRAWN AEROBIC AND ANAEROBIC Blood Culture adequate volume Performed at [facility], [address], [place], [place] [address]    Culture   Final     NO GROWTH 2 DAYS Performed at [facility], [address] [place], [place] [address]    Report Status PENDING  Incomplete  SARS CORONAVIRUS 2 (TAT 6-24 HRS) Nasopharyngeal Nasopharyngeal Swab     Status: Abnormal   Collection Time: 04/30/19  1:23 AM   Specimen: Nasopharyngeal Swab  Result Value Ref Range Status   SARS Coronavirus 2 POSITIVE (A) NEGATIVE Final    Comment: RESULT CALLED TO, READ BACK BY AND VERIFIED WITH: NUSTAFA KOUJAK RN.@[email or web address]  ON 1.31.21 BY TCALDWELL MT. (NOTE) SARS-CoV-2 target nucleic acids are DETECTED. The SARS-CoV-2 RNA is generally detectable in upper and lower respiratory specimens during the acute phase of infection. Positive results are indicative of the presence of SARS-CoV-2 RNA. Clinical correlation with patient history and other diagnostic information is  necessary to determine patient infection status. Positive results do not rule out bacterial infection or co-infection with other viruses.  The expected result is Negative. Fact Sheet for Patients: 02-22-1997 Fact Sheet for Healthcare Providers: HairSlick.no This test is not yet approved or cleared by the quierodirigir.com FDA and  has been authorized for detection and/or diagnosis of SARS-CoV-2 by FDA under an Emergency Use Authorization (EUA). This EUA will remain  in effect (meaning this test can  be used) for the duration of the COVID-19 declaration under Section 564(b)(1) of the Act, 21 U.S.C. section 360bbb-3(b)(1), unless the authorization is terminated or revoked sooner. Performed at [facility], [address] [place], [place]   MRSA PCR Screening     Status: None   Collection Time: 04/30/19  4:04 AM   Specimen: Nasal Mucosa; Nasopharyngeal  Result Value Ref Range Status   MRSA by PCR NEGATIVE NEGATIVE Final    Comment:        The GeneXpert MRSA Assay (FDA approved for NASAL specimens only), is one  component of a comprehensive MRSA colonization surveillance program. It is not intended to diagnose MRSA infection nor to guide or monitor treatment for MRSA infections. Performed at [facility], [address] [place], [place]   Blood Cultures (routine x 2)     Status: None (Preliminary result)   Collection Time: 04/30/19  6:14 AM   Specimen: BLOOD  Result Value Ref Range Status   Specimen  Description   Final    BLOOD LEFT ANTECUBITAL Performed at [facility], [address] [place], [place] [address]    Special Requests   Final    BOTTLES DRAWN AEROBIC ONLY Blood Culture adequate volume Performed at [facility], [address] [place], [place] [address]    Culture   Final    NO GROWTH 1 DAY Performed at [facility], [address] [place], [place] [address]    Report Status PENDING  Incomplete     Radiology Studies: CT HEAD WO CONTRAST  Result Date: 04/29/2019 CLINICAL DATA:  Altered mental status. EXAM: CT HEAD WITHOUT CONTRAST TECHNIQUE: Contiguous axial images were obtained from the base of the skull through the vertex without intravenous contrast. COMPARISON:  May 04, 2016 FINDINGS: Brain: There is moderate severity cerebral atrophy with widening of the extra-axial spaces and ventricular dilatation. There are areas of decreased attenuation within the white matter tracts of the supratentorial brain, consistent with microvascular disease changes. A small to moderate sized area of cortical encephalomalacia, with adjacent chronic white matter low attenuation, is seen within the left frontal lobe. This represents a new finding when compared to the prior study. Vascular: No hyperdense vessel or unexpected calcification. Skull: Normal. Negative for fracture or focal lesion. Sinuses/Orbits: There is very mild bilateral ethmoid sinus mucosal thickening. Other: None. IMPRESSION: 1. Generalized cerebral  atrophy. 2. No acute intracranial abnormality. 3. Chronic left frontal lobe infarct. Electronically Signed   By: Aram Candelahaddeus  Houston M.D.   On: 04/29/2019 18:47   MR BRAIN WO CONTRAST  Result Date: 05/01/2019 CLINICAL DATA:  Stroke EXAM: MRI HEAD WITHOUT CONTRAST TECHNIQUE: Multiplanar, multiecho pulse sequences of the brain and surrounding structures were obtained without intravenous contrast. COMPARISON:  CT head 04/29/2019 FINDINGS: Brain: Multiple areas of acute infarction. Acute infarct in the cerebellum bilaterally. Acute infarct in the right tectum. Acute infarct left occipital pole. Small areas of acute infarct in the left frontal lobe and in the left parietal lobe. Advanced atrophy. Chronic infarct left frontal lobe. Chronic ischemic changes in the white matter bilaterally. Chronic infarct in the left midbrain and right cerebellum. Negative for hemorrhage or mass. Vascular: Normal arterial flow voids Skull and upper cervical spine: No focal skeletal lesion. Sinuses/Orbits: Mild mucosal edema paranasal sinuses. Bilateral cataract surgery. Other: None IMPRESSION: Multiple areas of acute infarct. Pattern suggestive of multiple emboli. Advanced atrophy. Chronic ischemic changes in the white matter and left frontal lobe. Electronically Signed   By: Marlan Palauharles  Clark M.D.   On: 05/01/2019 10:45   DG Chest Port 1 View  Result Date: 04/29/2019 CLINICAL DATA:  Altered mental status EXAM: PORTABLE CHEST 1 VIEW COMPARISON:  05/04/2016 FINDINGS: Patient is rotated. Heart size within normal limits. Calcific aortic knob. No focal airspace consolidation. No pleural effusion or pneumothorax. No acute osseous findings. IMPRESSION: No acute cardiopulmonary findings. Electronically Signed   By: Duanne GuessNicholas  Plundo D.O.   On: 04/29/2019 18:44   VAS US LOWER EXTREMITY VENOUS (DVT)  Result Date: 04/30/2019  Lower Venous Study Indications: Elevated Ddimer.  Risk Factors: COVID 19 positive. Limitations: Poor ultrasound/tissue  interface and patient positioning, patient immobility. Comparison Study: No prior studies. Performing Technologist: Chanda BusingGregory Collins RVT  Examination Guidelines: A complete evaluation includes B-mode imaging, spectral Doppler, color Doppler, and power Doppler as needed of all accessible portions of each vessel. Bilateral testing is considered an integral part of a complete examination. Limited examinations for reoccurring indications may be performed as noted.  +---------+---------------+---------+-----------+----------+--------------+  RIGHT    CompressibilityPhasicitySpontaneityPropertiesThrombus Aging +---------+---------------+---------+-----------+----------+--------------+ CFV      Full           Yes      Yes                                 +---------+---------------+---------+-----------+----------+--------------+ SFJ      Full                                                        +---------+---------------+---------+-----------+----------+--------------+ FV Prox  Full                                                        +---------+---------------+---------+-----------+----------+--------------+ FV Mid   Full                                                        +---------+---------------+---------+-----------+----------+--------------+ FV DistalFull                                                        +---------+---------------+---------+-----------+----------+--------------+ PFV      Full                                                        +---------+---------------+---------+-----------+----------+--------------+ POP      Full           Yes      Yes                                 +---------+---------------+---------+-----------+----------+--------------+ PTV      Full                                                        +---------+---------------+---------+-----------+----------+--------------+ PERO     Full                                                         +---------+---------------+---------+-----------+----------+--------------+   +---------+---------------+---------+-----------+----------+--------------+ LEFT     CompressibilityPhasicitySpontaneityPropertiesThrombus Aging +---------+---------------+---------+-----------+----------+--------------+ CFV      Full           Yes      Yes                                 +---------+---------------+---------+-----------+----------+--------------+  SFJ      Full                                                        +---------+---------------+---------+-----------+----------+--------------+ FV Prox  Full                                                        +---------+---------------+---------+-----------+----------+--------------+ FV Mid   Full                                                        +---------+---------------+---------+-----------+----------+--------------+ FV DistalNone           No       No                   Acute          +---------+---------------+---------+-----------+----------+--------------+ PFV      Full                                                        +---------+---------------+---------+-----------+----------+--------------+ POP      None           No       No                   Acute          +---------+---------------+---------+-----------+----------+--------------+ PTV                                                   Not visualized +---------+---------------+---------+-----------+----------+--------------+ PERO                                                  Not visualized +---------+---------------+---------+-----------+----------+--------------+     Summary: Right: There is no evidence of deep vein thrombosis in the lower extremity. However, portions of this examination were limited- see technologist comments above. No cystic structure found in the popliteal fossa. Left: Findings  consistent with acute deep vein thrombosis involving the left femoral vein, and left popliteal vein. No cystic structure found in the popliteal fossa.  *See table(s) above for measurements and observations.    Preliminary     Scheduled Meds: . vitamin C  500 mg Oral Daily  . chlorhexidine  15 mL Mouth Rinse BID  . Chlorhexidine Gluconate Cloth  6 each Topical Daily  . dexamethasone (DECADRON) injection  6 mg Intravenous QHS  . mouth rinse  15 mL Mouth Rinse q12n4p  . metoprolol tartrate  2.5 mg Intravenous Q6H  . zinc sulfate  220  mg Oral Daily   Continuous Infusions: . sodium chloride 100 mL/hr at 05/01/19 1112  . cefTRIAXone (ROCEPHIN)  IV Stopped (05/01/19 0935)  . heparin    . remdesivir 100 mg in NS 100 mL Stopped (05/01/19 1001)     LOS: 2 days   Rickey BarbaraStephen Calob Baskette, MD [facility] Pager On Amion  If 7PM-7AM, please contact night-coverage 05/01/2019, 12:07 PM

## 2019-05-01 NOTE — Progress Notes (Signed)
Report called to Edgardo Roys, Charity fundraiser at [facility].

## 2019-05-01 NOTE — Progress Notes (Signed)
ANTICOAGULATION CONSULT NOTE - Follow-Up  Pharmacy Consult for heparin Indication: VTE treatment, possible PE, new CVA  No Known Allergies  Patient Measurements: Height: 5\' 6"  (167.6 cm) Weight: 148 lb 10.2 oz (67.4 kg) IBW/kg (Calculated) : 59.3 Heparin Dosing Weight: 67.4 kg  Vital Signs: Temp: 97.8 F (36.6 C) (02/01 2000) Temp Source: Axillary (02/01 2000) BP: 149/53 (02/01 2000) Pulse Rate: 67 (02/01 2000)  Labs: Recent Labs    04/29/19 1819 04/29/19 1819 04/30/19 0614 05/01/19 0341 05/01/19 2149  HGB 14.7   < > 12.5 12.9  --   HCT 45.7  --  38.3 40.8  --   PLT 134*  --  60* 68*  --   HEPARINUNFRC  --   --   --   --  0.28*  CREATININE 4.00*  --  3.08* 2.64*  --    < > = values in this interval not displayed.    Estimated Creatinine Clearance: 13.8 mL/min (A) (by C-G formula based on SCr of 2.64 mg/dL (H)).   Medical History: Past Medical History:  Diagnosis Date  . Alzheimer disease ([place])   . Dementia ([place])     Medications:  Medications Prior to Admission  Medication Sig Dispense Refill Last Dose  . acetaminophen (TYLENOL) 500 MG tablet Take 500 mg by mouth every 4 (four) hours as needed for moderate pain, fever or headache.   Unknown  . alum & mag hydroxide-simeth (MINTOX) [phone] MG/5ML suspension Take 30 mLs by mouth every 6 (six) hours as needed for indigestion or heartburn.   Unknown  . bisacodyl (DULCOLAX) 5 MG EC tablet Take 5 mg by mouth 2 (two) times daily.   04/29/2019 at Unknown time  . cholecalciferol (VITAMIN D) 1000 UNITS tablet Take 1,000 Units by mouth daily with breakfast.    04/29/2019 at Unknown time  . donepezil (ARICEPT) 10 MG tablet Take 10 mg by mouth at bedtime.   04/28/2019 at Unknown time  . guaifenesin (ROBAFEN) 100 MG/5ML syrup Take 200 mg by mouth every 6 (six) hours as needed for cough.   Unknown  . levothyroxine (SYNTHROID) 75 MCG tablet Take 75 mcg by mouth daily.   04/29/2019 at Unknown time  . loperamide (IMODIUM) 2 MG  capsule Take 2 mg by mouth as needed for diarrhea or loose stools.   Unknown  . magnesium hydroxide (MILK OF MAGNESIA) 400 MG/5ML suspension Take 30 mLs by mouth at bedtime as needed for mild constipation.   Unknown  . Melatonin 1 MG TABS Take 2 mg by mouth at bedtime.    04/28/2019 at Unknown time  . mirtazapine (REMERON) 30 MG tablet Take 30 mg by mouth at bedtime.   04/28/2019 at Unknown time  . Multiple Vitamin (TAB-A-VITE) TABS Take 1 tablet by mouth daily with breakfast.   04/29/2019 at Unknown time  . Neomycin-Bacitracin-Polymyxin (TRIPLE ANTIBIOTIC) [phone] OINT Apply 1 application topically daily as needed (wounds).    Unknown  . Oyster Shell (OYSTER CALCIUM) 500 MG TABS tablet Take 500 mg of elemental calcium by mouth 2 (two) times daily.   04/29/2019 at Unknown time  . PRESCRIPTION MEDICATION Take 118 mLs by mouth 3 (three) times daily. Mighty shakes   04/29/2019 at Unknown time  . sertraline (ZOLOFT) 25 MG tablet Take 25 mg by mouth daily.   04/29/2019 at Unknown time  . vitamin B-12 (CYANOCOBALAMIN) 1000 MCG tablet Take 1,000 mcg by mouth daily.   04/29/2019 at Unknown time    Assessment:  86 yo F Covid +  to start heparin with no bolus for presumed PE, acute LLE DVT and new stroke.  Neuro aware heparin to start for PE/DVT.    2/1: MRI head: multiple areas of acute infarct, pattern suggestive of multiple embol D Dimer > 20 x 3 days.  No bolus per MD orders.   Heparin level near goal on 950 units/hr.  No bleeding noted.  Goal of Therapy:  Heparin level 0.3 - 0.5 low goal w/ acute stroke Monitor platelets by anticoagulation protocol: Yes   Plan:  No bolus per MD request Increase heparin to 1050 units/hr  Next heparin level 0500 on 2/2. Daily heparin level & CBC while on heparin  [employer], Pharm.D., BCPS Clinical Pharmacist  05/01/2019 10:32 PM

## 2019-05-01 NOTE — Progress Notes (Signed)
ANTICOAGULATION CONSULT NOTE - Initial Consult  Pharmacy Consult for heparin Indication: VTE treatment> possible PE, acute DVT L LE  No Known Allergies  Patient Measurements: Height: 5\' 6"  (167.6 cm) Weight: 148 lb 10.2 oz (67.4 kg) IBW/kg (Calculated) : 59.3 Heparin Dosing Weight: 67.4 kg  Vital Signs: Temp: 97.2 F (36.2 C) (02/01 1200) Temp Source: Bladder (02/01 1200) BP: 120/57 (02/01 1200) Pulse Rate: 77 (02/01 1200)  Labs: Recent Labs    04/29/19 1819 04/29/19 1819 04/30/19 0614 05/01/19 0341  HGB 14.7   < > 12.5 12.9  HCT 45.7  --  38.3 40.8  PLT 134*  --  60* 68*  CREATININE 4.00*  --  3.08* 2.64*   < > = values in this interval not displayed.    Estimated Creatinine Clearance: 13.8 mL/min (A) (by C-G formula based on SCr of 2.64 mg/dL (H)).   Medical History: Past Medical History:  Diagnosis Date  . Alzheimer disease ([place])   . Dementia ([place])     Medications:  Medications Prior to Admission  Medication Sig Dispense Refill Last Dose  . acetaminophen (TYLENOL) 500 MG tablet Take 500 mg by mouth every 4 (four) hours as needed for moderate pain, fever or headache.   Unknown  . alum & mag hydroxide-simeth (MINTOX) [phone] MG/5ML suspension Take 30 mLs by mouth every 6 (six) hours as needed for indigestion or heartburn.   Unknown  . bisacodyl (DULCOLAX) 5 MG EC tablet Take 5 mg by mouth 2 (two) times daily.   04/29/2019 at Unknown time  . cholecalciferol (VITAMIN D) 1000 UNITS tablet Take 1,000 Units by mouth daily with breakfast.    04/29/2019 at Unknown time  . donepezil (ARICEPT) 10 MG tablet Take 10 mg by mouth at bedtime.   04/28/2019 at Unknown time  . guaifenesin (ROBAFEN) 100 MG/5ML syrup Take 200 mg by mouth every 6 (six) hours as needed for cough.   Unknown  . levothyroxine (SYNTHROID) 75 MCG tablet Take 75 mcg by mouth daily.   04/29/2019 at Unknown time  . loperamide (IMODIUM) 2 MG capsule Take 2 mg by mouth as needed for diarrhea or loose stools.    Unknown  . magnesium hydroxide (MILK OF MAGNESIA) 400 MG/5ML suspension Take 30 mLs by mouth at bedtime as needed for mild constipation.   Unknown  . Melatonin 1 MG TABS Take 2 mg by mouth at bedtime.    04/28/2019 at Unknown time  . mirtazapine (REMERON) 30 MG tablet Take 30 mg by mouth at bedtime.   04/28/2019 at Unknown time  . Multiple Vitamin (TAB-A-VITE) TABS Take 1 tablet by mouth daily with breakfast.   04/29/2019 at Unknown time  . Neomycin-Bacitracin-Polymyxin (TRIPLE ANTIBIOTIC) [phone] OINT Apply 1 application topically daily as needed (wounds).    Unknown  . Oyster Shell (OYSTER CALCIUM) 500 MG TABS tablet Take 500 mg of elemental calcium by mouth 2 (two) times daily.   04/29/2019 at Unknown time  . PRESCRIPTION MEDICATION Take 118 mLs by mouth 3 (three) times daily. Mighty shakes   04/29/2019 at Unknown time  . sertraline (ZOLOFT) 25 MG tablet Take 25 mg by mouth daily.   04/29/2019 at Unknown time  . vitamin B-12 (CYANOCOBALAMIN) 1000 MCG tablet Take 1,000 mcg by mouth daily.   04/29/2019 at Unknown time    Assessment:  86 yo F Covid + to start heparin with no bolus for presumed PE, acute LLE DVT and new stroke. Dopplers  + for LLE acute DVT in L femora &  popliteal veins.  Neuro aware heparin to start for PE/DVT.   2/1: MRI head: multiple areas of acute infarct, pattern suggestive of multiple embol 1/30 LMWH 40> 1/32 UFH 7500 q8>>UFH  SCr 4>>2.64, Hg 14.7>>12.9, PLTC 134> 68 D Dimer > 20 x 3 days.  No bolus per MD orders.   Goal of Therapy:  Heparin level 0.3 - 0.5 low goal w/ acute stroke Monitor platelets by anticoagulation protocol: Yes   Plan:  No bolus per MD request Start heparin at 950 units/hr and check 8 hr heparin level Daily heparin level & CBC while on heparin  Herby Abraham, Pharm.D 05/01/2019 12:12 PM

## 2019-05-01 NOTE — Progress Notes (Signed)
  Echocardiogram 2D Echocardiogram has been performed.  Celene Skeen 05/01/2019, 1:44 PM

## 2019-05-01 NOTE — Progress Notes (Signed)
Patient's left arm is flaccid.  CT 1/30 negative.  Patient on subq heparin, no other intervention available due to thrombocytopenia.  Weaning O2 down as tolerated, VSS.  NP [place] updated.  No new orders at this time.

## 2019-05-01 NOTE — Progress Notes (Signed)
Patient transported to MRI and back to room without any issues.

## 2019-05-02 ENCOUNTER — Inpatient Hospital Stay (HOSPITAL_COMMUNITY): Payer: Medicare FFS

## 2019-05-02 DIAGNOSIS — Z7189 Other specified counseling: Secondary | ICD-10-CM

## 2019-05-02 DIAGNOSIS — I639 Cerebral infarction, unspecified: Secondary | ICD-10-CM

## 2019-05-02 DIAGNOSIS — I6310 Cerebral infarction due to embolism of unspecified precerebral artery: Secondary | ICD-10-CM

## 2019-05-02 DIAGNOSIS — A4189 Other specified sepsis: Principal | ICD-10-CM

## 2019-05-02 DIAGNOSIS — I6330 Cerebral infarction due to thrombosis of unspecified cerebral artery: Secondary | ICD-10-CM | POA: Insufficient documentation

## 2019-05-02 DIAGNOSIS — R627 Adult failure to thrive: Secondary | ICD-10-CM

## 2019-05-02 DIAGNOSIS — Z515 Encounter for palliative care: Secondary | ICD-10-CM

## 2019-05-02 LAB — COMPREHENSIVE METABOLIC PANEL
ALT: 57 U/L — ABNORMAL HIGH (ref 0–44)
ALT: 105 U/L — ABNORMAL HIGH (ref 0–44)
AST: 135 U/L — ABNORMAL HIGH (ref 15–41)
AST: 267 U/L — ABNORMAL HIGH (ref 15–41)
Albumin: 1.6 g/dL — ABNORMAL LOW (ref 3.5–5.0)
Albumin: 2.2 g/dL — ABNORMAL LOW (ref 3.5–5.0)
Alkaline Phosphatase: 60 U/L (ref 38–126)
Alkaline Phosphatase: 86 U/L (ref 38–126)
Anion gap: 10 (ref 5–15)
Anion gap: 12 (ref 5–15)
BUN: 70 mg/dL — ABNORMAL HIGH (ref 8–23)
BUN: 93 mg/dL — ABNORMAL HIGH (ref 8–23)
CO2: 12 mmol/L — ABNORMAL LOW (ref 22–32)
CO2: 11 mmol/L — ABNORMAL LOW (ref 22–32)
Calcium: 6.5 mg/dL — ABNORMAL LOW (ref 8.9–10.3)
Calcium: 8.3 mg/dL — ABNORMAL LOW (ref 8.9–10.3)
Chloride: 97 mmol/L — ABNORMAL LOW (ref 98–111)
Chloride: 130 mmol/L — ABNORMAL HIGH (ref 98–111)
Creatinine, Ser: 1.92 mg/dL — ABNORMAL HIGH (ref 0.44–1.00)
Creatinine, Ser: 2.07 mg/dL — ABNORMAL HIGH (ref 0.44–1.00)
GFR calc Af Amer: 26 mL/min — ABNORMAL LOW (ref >60)
GFR calc Af Amer: 24 mL/min — ABNORMAL LOW (ref >60)
GFR calc non Af Amer: 23 mL/min — ABNORMAL LOW (ref >60)
GFR calc non Af Amer: 21 mL/min — ABNORMAL LOW (ref >60)
Glucose, Bld: 1375 mg/dL (ref 70–99)
Glucose, Bld: 285 mg/dL — ABNORMAL HIGH (ref 70–99)
Potassium: 2.3 mmol/L — CL (ref 3.5–5.1)
Potassium: 3.5 mmol/L (ref 3.5–5.1)
Sodium: 119 mmol/L — CL (ref 135–145)
Sodium: 153 mmol/L — ABNORMAL HIGH (ref 135–145)
Total Bilirubin: 0.4 mg/dL (ref 0.3–1.2)
Total Bilirubin: 0.6 mg/dL (ref 0.3–1.2)
Total Protein: 4.5 g/dL — ABNORMAL LOW (ref 6.5–8.1)
Total Protein: 6 g/dL — ABNORMAL LOW (ref 6.5–8.1)

## 2019-05-02 LAB — POC SARS CORONAVIRUS 2 AG: SARS Coronavirus 2 Ag: NEGATIVE

## 2019-05-02 LAB — CBC WITH DIFFERENTIAL/PLATELET
Abs Immature Granulocytes: 0.06 10*3/uL (ref 0.00–0.07)
Basophils Absolute: 0 10*3/uL (ref 0.0–0.1)
Basophils Relative: 0 %
Eosinophils Absolute: 0 10*3/uL (ref 0.0–0.5)
Eosinophils Relative: 0 %
HCT: 33.7 % — ABNORMAL LOW (ref 36.0–46.0)
Hemoglobin: 9.5 g/dL — ABNORMAL LOW (ref 12.0–15.0)
Immature Granulocytes: 1 %
Lymphocytes Relative: 4 %
Lymphs Abs: 0.4 10*3/uL — ABNORMAL LOW (ref 0.7–4.0)
MCH: 30 pg (ref 26.0–34.0)
MCHC: 28.2 g/dL — ABNORMAL LOW (ref 30.0–36.0)
MCV: 106.3 fL — ABNORMAL HIGH (ref 80.0–100.0)
Monocytes Absolute: 0.4 10*3/uL (ref 0.1–1.0)
Monocytes Relative: 4 %
Neutro Abs: 9.2 10*3/uL — ABNORMAL HIGH (ref 1.7–7.7)
Neutrophils Relative %: 91 %
Platelets: UNDETERMINED 10*3/uL (ref 150–400)
RBC: 3.17 MIL/uL — ABNORMAL LOW (ref 3.87–5.11)
RDW: 16.2 % — ABNORMAL HIGH (ref 11.5–15.5)
WBC: 10 10*3/uL (ref 4.0–10.5)
nRBC: 0 % (ref 0.0–0.2)

## 2019-05-02 LAB — LIPID PANEL
Cholesterol: 59 mg/dL (ref 0–200)
Cholesterol: 75 mg/dL (ref 0–200)
HDL: 18 mg/dL — ABNORMAL LOW (ref >40)
HDL: 25 mg/dL — ABNORMAL LOW (ref >40)
LDL Cholesterol: 37 mg/dL (ref 0–99)
LDL Cholesterol: 45 mg/dL (ref 0–99)
Total CHOL/HDL Ratio: 3.3 RATIO
Total CHOL/HDL Ratio: 3 RATIO
Triglycerides: 22 mg/dL (ref <150)
Triglycerides: 26 mg/dL (ref <150)
VLDL: 4 mg/dL (ref 0–40)
VLDL: 5 mg/dL (ref 0–40)

## 2019-05-02 LAB — HEMOGLOBIN A1C
Hgb A1c MFr Bld: 5.7 % — ABNORMAL HIGH (ref 4.8–5.6)
Mean Plasma Glucose: 116.89 mg/dL

## 2019-05-02 LAB — HEPARIN LEVEL (UNFRACTIONATED): Heparin Unfractionated: 0.73 IU/mL — ABNORMAL HIGH (ref 0.30–0.70)

## 2019-05-02 LAB — C-REACTIVE PROTEIN
CRP: 5.4 mg/dL — ABNORMAL HIGH (ref <1.0)
CRP: 8.8 mg/dL — ABNORMAL HIGH (ref <1.0)

## 2019-05-02 LAB — D-DIMER, QUANTITATIVE: D-Dimer, Quant: 15.57 ug/mL-FEU — ABNORMAL HIGH (ref 0.00–0.50)

## 2019-05-02 MED ORDER — GLYCOPYRROLATE 0.2 MG/ML IJ SOLN: 0.2000 mg | INTRAMUSCULAR | Status: DC | PRN

## 2019-05-02 MED ORDER — HYDROMORPHONE HCL 1 MG/ML IJ SOLN: 0.5000 mg | INTRAMUSCULAR | Status: DC | PRN

## 2019-05-02 MED ORDER — LORAZEPAM 1 MG PO TABS: 1.0000 mg | ORAL_TABLET | ORAL | Status: DC | PRN

## 2019-05-02 MED ORDER — LORAZEPAM 2 MG/ML IJ SOLN: 1.0000 mg | INTRAMUSCULAR | Status: DC | PRN

## 2019-05-02 MED ORDER — GLYCOPYRROLATE 0.2 MG/ML IJ SOLN: 0.2000 mg | INTRAMUSCULAR | Status: AC | PRN

## 2019-05-02 MED ORDER — CHLORHEXIDINE GLUCONATE CLOTH 2 % EX PADS
6.0000 | MEDICATED_PAD | Freq: Every day | CUTANEOUS | Status: DC
  Administered 2019-05-02: 11:00:00 6 via TOPICAL

## 2019-05-02 MED ORDER — HALOPERIDOL LACTATE 2 MG/ML PO CONC: 0.5000 mg | ORAL | 0 refills | Status: AC | PRN

## 2019-05-02 MED ORDER — HALOPERIDOL 0.5 MG PO TABS: 0.5000 mg | ORAL_TABLET | ORAL | Status: DC | PRN

## 2019-05-02 MED ORDER — GLYCOPYRROLATE 1 MG PO TABS: 1.0000 mg | ORAL_TABLET | ORAL | Status: DC | PRN

## 2019-05-02 MED ORDER — HALOPERIDOL LACTATE 2 MG/ML PO CONC
0.5000 mg | ORAL | Status: DC | PRN
  Filled 2019-05-02: qty 0.3

## 2019-05-02 MED ORDER — HALOPERIDOL LACTATE 5 MG/ML IJ SOLN: 0.5000 mg | INTRAMUSCULAR | Status: DC | PRN

## 2019-05-02 MED ORDER — LORAZEPAM 2 MG/ML PO CONC: 1.0000 mg | ORAL | Status: DC | PRN

## 2019-05-02 MED ORDER — LORAZEPAM 2 MG/ML IJ SOLN: 1.0000 mg | INTRAMUSCULAR | 0 refills | Status: AC | PRN

## 2019-05-02 MED ORDER — HALOPERIDOL LACTATE 5 MG/ML IJ SOLN: 0.5000 mg | INTRAMUSCULAR | Status: AC | PRN

## 2019-05-02 NOTE — TOC Initial Note (Signed)
Transition of Care [facility]) - Initial/Assessment Note    Patient Details  Name: Renee Kramer MRN: [id number] Date of Birth: [birth year]  Transition [facility]) CM/SW Contact:    Mearl Latin, LCSW Phone Number: 05/02/2019, 11:48 AM  Clinical Narrative:                 CSW received consult from Palliative Care for residential hospice placement. CSW spoke with patient's son and presented options. Patient's son is very realistic regarding patient needs and he reports agreement to proceed with [facility] as they are able to accept COVID patients. CSW sent referral to [facility] for review.   Expected Discharge Plan: Hospice Medical Facility Barriers to Discharge: Other (comment)(Pending hospice bed)   Patient Goals and CMS Choice Patient states their goals for this hospitalization and ongoing recovery are:: Comfort CMS Medicare.gov Compare Post Acute Care list provided to:: Patient Represenative (must comment)(Son, Tammy Sours) Choice offered to / list presented to : Adult Children  Expected Discharge Plan and Services Expected Discharge Plan: Hospice Medical Facility In-house Referral: Clinical Social Work, Hospice / Palliative Care   Post Acute Care Choice: Hospice Living arrangements for the past 2 months: Assisted Living Facility(Memory Care)                                      Prior Living Arrangements/Services Living arrangements for the past 2 months: Assisted Living Facility(Memory Care) Lives with:: Facility Resident Patient language and need for interpreter reviewed:: Yes Do you feel safe going back to the place where you live?: No   Needs comfort care  Need for Family Participation in Patient Care: Yes (Comment) Care giver support system in place?: Yes (comment)   Criminal Activity/Legal Involvement Pertinent to Current Situation/Hospitalization: No - Comment as needed  Activities of Daily Living Home Assistive Devices/Equipment: None ADL Screening  (condition at time of admission) Patient's cognitive ability adequate to safely complete daily activities?: Yes(spoke to nursing home) Is the patient deaf or have difficulty hearing?: Yes Does the patient have difficulty seeing, even when wearing glasses/contacts?: Yes Does the patient have difficulty concentrating, remembering, or making decisions?: Yes Patient able to express need for assistance with ADLs?: Yes Does the patient have difficulty dressing or bathing?: Yes Independently performs ADLs?: No Communication: Dependent Is this a change from baseline?: Pre-admission baseline Dressing (OT): Dependent Is this a change from baseline?: Pre-admission baseline Grooming: Dependent Is this a change from baseline?: Pre-admission baseline Feeding: Dependent Is this a change from baseline?: Pre-admission baseline Bathing: Dependent Is this a change from baseline?: Pre-admission baseline Toileting: Dependent Is this a change from baseline?: Pre-admission baseline In/Out Bed: Dependent Is this a change from baseline?: Pre-admission baseline Walks in Home: Dependent Is this a change from baseline?: Pre-admission baseline Does the patient have difficulty walking or climbing stairs?: Yes Weakness of Legs: Both Weakness of Arms/Hands: Both  Permission Sought/Granted Permission sought to share information with : Facility Medical sales representative, Family Supports Permission granted to share information with : No  Share Information with NAME: Tammy Sours  Permission granted to share info w AGENCY: Hospice  Permission granted to share info w Relationship: Son  Permission granted to share info w Contact Information: [phone]  Emotional Assessment   Attitude/Demeanor/Rapport: Unable to Assess Affect (typically observed): Unable to Assess   Alcohol / Substance Use: Not Applicable Psych Involvement: No (comment)  Admission diagnosis:  Dehydration [E86.0] AKI (  acute kidney injury) ([employer])  [N17.9] COVID-19 virus infection [U07.1] Patient Active Problem List   Diagnosis Date Noted  . Cerebral thrombosis with cerebral infarction 05/02/2019  . Thrombocytopenia ([place]) 04/30/2019  . DNR (do not resuscitate) discussion 04/30/2019  . AKI (acute kidney injury) ([place]) 04/29/2019  . Sepsis due to COVID-19 ([place]) 04/29/2019  . FTT (failure to thrive) in adult 04/29/2019  . Dehydration 04/29/2019  . Hypernatremia 04/29/2019  . Dementia ([place]) 04/29/2019   PCP:  Patient, No Pcp Per Pharmacy:   [facility] [address] [place] [address] Phone: [phone] Fax: [phone]  [place], [place] [place] [place] [address] Phone: [phone] Fax: [phone]     Social Determinants of Health (SDOH) Interventions    Readmission Risk Interventions No flowsheet data found.

## 2019-05-02 NOTE — Consult Note (Signed)
Consultation Note Date: 05/02/2019   Patient Name: Renee Kramer  DOB: Jul 13, 1928  MRN: [id number]  Age / Sex: 86 y.o., female  PCP: Patient, No Pcp Per Referring Physician: Hosie Poisson, MD  Reason for Consultation: Establishing goals of care, Hospice Evaluation and Terminal Care  HPI/Patient Profile: 86 y.o. female  with past medical history of advanced dementia admitted on 04/29/2019 with AMS and decreased PO intake. Patient was found to be COVID positive at facility and was confirmed on admission.  Found to have AKI with creatinine up to 4, suspected to be d/t dehydration. Found to have LLE DVT and high suspicion for PE. Also found to have acute CVA with multiple emboli. Patient remains essentially unresponsive with no PO intake. PMT consulted to assist with [place].   Clinical Assessment and Goals of Care: I have reviewed medical records including EPIC notes, labs and imaging, received report from RN and Dr. Karleen Hampshire, and then spoke with patient's son, Marya Amsler, via telephone  to discuss diagnosis prognosis, [place], EOL wishes, disposition and options.  I introduced Palliative Medicine as specialized medical care for people living with serious illness. It focuses on providing relief from the symptoms and stress of a serious illness. The goal is to improve quality of life for both the patient and the family.  As far as functional and nutritional status, Marya Amsler tells me about patient's advanced dementia - she has been wheelchair bound for a while. He has been unable to see her for the past year due to covid restrictions so he is unsure how communicative she has been.     We discussed her current illness and what it means in the larger context of her on-going co-morbidities.  Natural disease trajectory and expectations at EOL were discussed. Specifically discussed patient's MRI results, unresponsiveness, and lack of PO intake - discussed concerns that patient is at end  of life. Marya Amsler agrees that he feels she is as well.   I attempted to elicit values and goals of care important to the patient.  Marya Amsler shares that patient would want [place] to focus on comfort given her severe illness and he agrees. Comfort care was detailed for Marya Amsler and all questions answered.   Discussed potential discharge out of hospital to either hospice facility or her prior facility with hospice services - Marya Amsler is agreeable to this.   Discussed visitor policy for Covid patient's at end of life - at this time, Marya Amsler does not plan to come visit.   Questions and concerns were addressed. The family was encouraged to call with questions or concerns.   Primary Decision Maker NEXT OF KIN - son Bette Brienza    SUMMARY OF RECOMMENDATIONS   - transition to full comfort care - discontinue all care orders and medications not focused on comfort - TOC consult for hospice services at discharge (either back to facility or residential hospice facility)  Code Status/Advance Care Planning:  DNR   Symptom Management:   PRN dilaudid, haldol, ativan, robinul  Continue mouthcare  Additional Recommendations (Limitations, Scope, Preferences):  Full Comfort Care  Psycho-social/Spiritual:   Desire for further Chaplaincy support:no  Additional Recommendations: Education on Hospice  Prognosis:   < 2 weeks  Discharge Planning: Hospice facility vs back to prior facility with hospice services      Primary Diagnoses: Present on Admission: . AKI (acute kidney injury) ([place]) . FTT (failure to thrive) in adult . Dehydration . Hypernatremia . Dementia ([place])   I have reviewed the medical record, interviewed the  patient and family, and examined the patient. The following aspects are pertinent.  Past Medical History:  Diagnosis Date  . Alzheimer disease ([facility])   . Dementia [facility])    Social History   Socioeconomic History  . Marital status: Widowed    Spouse name: Not on file  . Number of  children: Not on file  . Years of education: Not on file  . Highest education level: Not on file  Occupational History  . Not on file  Tobacco Use  . Smoking status: Never Smoker  . Smokeless tobacco: Never Used  Substance and Sexual Activity  . Alcohol use: No  . Drug use: No  . Sexual activity: Not on file  Other Topics Concern  . Not on file  Social History Narrative  . Not on file   Social Determinants of Health   Financial Resource Strain:   . Difficulty of Paying Living Expenses: Not on file  Food Insecurity:   . Worried About Programme researcher, broadcasting/film/video in the Last Year: Not on file  . Ran Out of Food in the Last Year: Not on file  Transportation Needs:   . Lack of Transportation (Medical): Not on file  . Lack of Transportation (Non-Medical): Not on file  Physical Activity:   . Days of Exercise per Week: Not on file  . Minutes of Exercise per Session: Not on file  Stress:   . Feeling of Stress : Not on file  Social Connections:   . Frequency of Communication with Friends and Family: Not on file  . Frequency of Social Gatherings with Friends and Family: Not on file  . Attends Religious Services: Not on file  . Active Member of Clubs or Organizations: Not on file  . Attends Banker Meetings: Not on file  . Marital Status: Not on file   History reviewed. No pertinent family history. Scheduled Meds: . mouth rinse  15 mL Mouth Rinse q12n4p   Continuous Infusions: PRN Meds:.[DISCONTINUED] glycopyrrolate **OR** glycopyrrolate **OR** glycopyrrolate, [DISCONTINUED] haloperidol **OR** haloperidol **OR** haloperidol lactate, HYDROmorphone (DILAUDID) injection, [DISCONTINUED] LORazepam **OR** LORazepam **OR** LORazepam, [DISCONTINUED] ondansetron **OR** ondansetron (ZOFRAN) IV No Known Allergies  Vital Signs: BP 139/75 (BP Location: Right Arm)   Pulse 71   Temp 98.2 F (36.8 C) (Rectal)   Resp 19   Ht 5\' 6"  (1.676 m)   Wt 67.4 kg   SpO2 96%   BMI 23.99 kg/m    Pain Scale: Faces   Pain Score: 0-No pain   SpO2: SpO2: 96 % O2 Device:SpO2: 96 % O2 Flow Rate: .O2 Flow Rate (L/min): 4 L/min  IO: Intake/output summary:   Intake/Output Summary (Last 24 hours) at 05/02/2019 1143 Last data filed at 05/02/2019 1046 Gross per 24 hour  Intake 1248.28 ml  Output 500 ml  Net 748.28 ml    LBM: Last BM Date: 05/01/19 Baseline Weight: Weight: 67.4 kg Most recent weight: Weight: 67.4 kg     Palliative Assessment/Data: PPS 10%    The above conversation was completed via telephone due to the visitor restrictions during the COVID-19 pandemic. Thorough chart review and discussion with necessary members of the care team was completed as part of assessment. All issues were discussed and addressed but no physical exam was performed.  Time Total: 50 minutes Greater than 50%  of this time was spent counseling and coordinating care related to the above assessment and plan.  06/29/19, DNP, AGNP-C Palliative Medicine Team [phone] Pager: [phone]

## 2019-05-02 NOTE — Progress Notes (Signed)
[facility] arrived to take patient to the facility.

## 2019-05-02 NOTE — Progress Notes (Signed)
Patient transferred to comfort care per order. At this time patient is resting in bed, no acute distress noted. Will continue to monitor.

## 2019-05-02 NOTE — Progress Notes (Signed)
Heparin drip rate was changed to 9.5 ml/hr per pharmacy. Will continue to monitor.

## 2019-05-02 NOTE — Discharge Summary (Signed)
Physician Discharge Summary  Renee Kramer ZOX:[id number] DOB: 01/22/1929 DOA: 04/29/2019  PCP: Patient, No Pcp Per  Admit date: 04/29/2019 Discharge date: 05/02/2019  Admitted From: Home.  Disposition:   Residential Hospice  Recommendations for Outpatient Follow-up:  Follow up with Hospice MD as recommended.   Discharge Condition:Hospice.  CODE STATUS:comfort care.  Diet recommendation: comfort feeds.   Brief/Interim Summary:  86 y.o.femalewith medical history significant ofadvanced dementia reportedly Covid positive by nursing home 6 days ago but cannot find confirmation of this sent in because of altered mental status and worsening weakness and decreased p.o. intake. She was found to be in sepsis from COVID 19 pneumonia and UTI, .  She was also found to have left lower extremity DVT and started on IV heparin She had an MRI of the brain done which showed multiple bilateral acute infarct consistent with embolic shower.  Neurology consulted and transferred the patient from [facility] to [facility].  Palliative care consulted and after discussions with the family transition her to comfort care.  Discharge Diagnoses:  Principal Problem:   Sepsis due to COVID-19 [facility]) Active Problems:   AKI (acute kidney injury) (HCC)   FTT (failure to thrive) in adult   Dehydration   Hypernatremia   Dementia (HCC)   Thrombocytopenia (HCC)   DNR (do not resuscitate) discussion   Cerebral thrombosis with cerebral infarction   Cerebrovascular accident (CVA) due to embolism of precerebral artery (HCC)   Comfort measures only status   Severe sepsis with COVID-19 viral pneumonia and urinary tract infection patient received few doses of remdesivir and Decadron, Rocephin. They were discontinued today and she is transition to comfort measures. .   Acute renal failure probably secondary to poor oral intake, dehydration, sepsis, Covid viral pneumonia.    Failure to thrive Probably secondary to  dementia, multiple infarcts and multiple medical comorbidities.    Hypernatremia Secondary to poor oral intake and free fluid deficits    Left lower extremity DVT was initially started on heparin and discontinued after transition to comfort measures.    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 Palliative care consulted and transition her to full comfort measures.   Discharge Instructions  Discharge Instructions    Discharge instructions   Complete by: As directed    Please follow up with Hospice MD as recommended.     Allergies as of 05/02/2019   No Known Allergies     Medication List    STOP taking these medications   acetaminophen 500 MG tablet Commonly known as: TYLENOL   bisacodyl 5 MG EC tablet Commonly known as: DULCOLAX   cholecalciferol 1000 units tablet Commonly known as: VITAMIN D   donepezil 10 MG tablet Commonly known as: ARICEPT   levothyroxine 75 MCG tablet Commonly known as: SYNTHROID   loperamide 2 MG capsule Commonly known as: IMODIUM   magnesium hydroxide 400 MG/5ML suspension Commonly known as: MILK OF MAGNESIA   Melatonin 1 MG Tabs   Mintox 200-200-20 MG/5ML suspension Generic drug: alum & mag hydroxide-simeth   mirtazapine 30 MG tablet Commonly known as: REMERON   oyster calcium 500 MG Tabs tablet   PRESCRIPTION MEDICATION   Robafen 100 MG/5ML syrup Generic drug: guaifenesin   sertraline 25 MG tablet Commonly known as: ZOLOFT   Tab-A-Vite Tabs   Triple Antibiotic [phone] Oint   vitamin B-12 1000 MCG tablet Commonly known as: CYANOCOBALAMIN     TAKE these medications  glycopyrrolate 0.2 MG/ML injection Commonly known as: ROBINUL Inject 1 mL (0.2 mg total) into the skin every 4 (four) hours as needed (excessive secretions).   haloperidol 2 MG/ML solution Commonly known as: HALDOL Place 0.3 mLs (0.6 mg total) under  the tongue every 4 (four) hours as needed for agitation (or delirium).   haloperidol lactate 5 MG/ML injection Commonly known as: HALDOL Inject 0.1 mLs (0.5 mg total) into the vein every 4 (four) hours as needed (or delirium).   LORazepam 2 MG/ML injection Commonly known as: ATIVAN Inject 0.5 mLs (1 mg total) into the vein every 4 (four) hours as needed for anxiety.       No Known Allergies  Consultations:  Neurology  Palliative care   Procedures/Studies: CT HEAD WO CONTRAST  Result Date: 04/29/2019 CLINICAL DATA:  Altered mental status. EXAM: CT HEAD WITHOUT CONTRAST TECHNIQUE: Contiguous axial images were obtained from the base of the skull through the vertex without intravenous contrast. COMPARISON:  May 04, 2016 FINDINGS: Brain: There is moderate severity cerebral atrophy with widening of the extra-axial spaces and ventricular dilatation. There are areas of decreased attenuation within the white matter tracts of the supratentorial brain, consistent with microvascular disease changes. A small to moderate sized area of cortical encephalomalacia, with adjacent chronic white matter low attenuation, is seen within the left frontal lobe. This represents a new finding when compared to the prior study. Vascular: No hyperdense vessel or unexpected calcification. Skull: Normal. Negative for fracture or focal lesion. Sinuses/Orbits: There is very mild bilateral ethmoid sinus mucosal thickening. Other: None. IMPRESSION: 1. Generalized cerebral atrophy. 2. No acute intracranial abnormality. 3. Chronic left frontal lobe infarct. Electronically Signed   By: Virgina Norfolk M.D.   On: 04/29/2019 18:47   MR BRAIN WO CONTRAST  Result Date: 05/01/2019 CLINICAL DATA:  Stroke EXAM: MRI HEAD WITHOUT CONTRAST TECHNIQUE: Multiplanar, multiecho pulse sequences of the brain and surrounding structures were obtained without intravenous contrast. COMPARISON:  CT head 04/29/2019 FINDINGS: Brain: Multiple  areas of acute infarction. Acute infarct in the cerebellum bilaterally. Acute infarct in the right tectum. Acute infarct left occipital pole. Small areas of acute infarct in the left frontal lobe and in the left parietal lobe. Advanced atrophy. Chronic infarct left frontal lobe. Chronic ischemic changes in the white matter bilaterally. Chronic infarct in the left midbrain and right cerebellum. Negative for hemorrhage or mass. Vascular: Normal arterial flow voids Skull and upper cervical spine: No focal skeletal lesion. Sinuses/Orbits: Mild mucosal edema paranasal sinuses. Bilateral cataract surgery. Other: None IMPRESSION: Multiple areas of acute infarct. Pattern suggestive of multiple emboli. Advanced atrophy. Chronic ischemic changes in the white matter and left frontal lobe. Electronically Signed   By: Franchot Gallo M.D.   On: 05/01/2019 10:45   DG Chest Port 1 View  Result Date: 04/29/2019 CLINICAL DATA:  Altered mental status EXAM: PORTABLE CHEST 1 VIEW COMPARISON:  05/04/2016 FINDINGS: Patient is rotated. Heart size within normal limits. Calcific aortic knob. No focal airspace consolidation. No pleural effusion or pneumothorax. No acute osseous findings. IMPRESSION: No acute cardiopulmonary findings. Electronically Signed   By: Davina Poke D.O.   On: 04/29/2019 18:44   VAS US CAROTID (at [facility] and [facility] only)  Result Date: 05/02/2019 Carotid Arterial Duplex Study Indications:       CVA. Limitations        Today's exam was limited due to patient positioning. Comparison Study:  no prior Performing Technologist: Abram Sander RVS  Examination Guidelines: A complete evaluation includes  B-mode imaging, spectral Doppler, color Doppler, and power Doppler as needed of all accessible portions of each vessel. Bilateral testing is considered an integral part of a complete examination. Limited examinations for reoccurring indications may be performed as noted.  Right Carotid Findings:  +----------+--------+--------+--------+------------------+--------+           PSV cm/sEDV cm/sStenosisPlaque DescriptionComments +----------+--------+--------+--------+------------------+--------+ CCA Prox  63                      heterogenous               +----------+--------+--------+--------+------------------+--------+ CCA Distal41                      heterogenous               +----------+--------+--------+--------+------------------+--------+ ICA Prox  42      7       1-39%   heterogenous               +----------+--------+--------+--------+------------------+--------+ ICA Distal37      7                                          +----------+--------+--------+--------+------------------+--------+ ECA       49                                        tortuous +----------+--------+--------+--------+------------------+--------+ +----------+--------+-------+--------+-------------------+           PSV cm/sEDV cmsDescribeArm Pressure (mmHG) +----------+--------+-------+--------+-------------------+ [id number]                                         +----------+--------+-------+--------+-------------------+ +---------+--------+--+--------+-+---------+ VertebralPSV cm/s36EDV cm/s6Antegrade +---------+--------+--+--------+-+---------+  Left Carotid Findings: +----------+--------+--------+--------+------------------+---------------------+           PSV cm/sEDV cm/sStenosisPlaque DescriptionComments              +----------+--------+--------+--------+------------------+---------------------+ CCA Prox  70                      heterogenous                            +----------+--------+--------+--------+------------------+---------------------+ CCA Distal41      8               heterogenous                            +----------+--------+--------+--------+------------------+---------------------+ ICA Prox  46      8       1-39%    heterogenous                            +----------+--------+--------+--------+------------------+---------------------+ ICA Distal                                          not visualized due to  patient positioning   +----------+--------+--------+--------+------------------+---------------------+ ECA       68                                                              +----------+--------+--------+--------+------------------+---------------------+ +----------+--------+--------+--------+-------------------+           PSV cm/sEDV cm/sDescribeArm Pressure (mmHG) +----------+--------+--------+--------+-------------------+ Subclavian208                                         +----------+--------+--------+--------+-------------------+ +---------+--------+--+--------+---------+ VertebralPSV cm/s75EDV cm/sAntegrade +---------+--------+--+--------+---------+   Summary: Right Carotid: Velocities in the right ICA are consistent with a 1-39% stenosis. Left Carotid: Velocities in the left ICA are consistent with a 1-39% stenosis. Vertebrals: Bilateral vertebral arteries demonstrate antegrade flow. *See table(s) above for measurements and observations.  Electronically signed by Delia HeadyPramod Sethi MD on 05/02/2019 at 1:06:05 PM.    Final    VAS US LOWER EXTREMITY VENOUS (DVT)  Result Date: 05/01/2019  Lower Venous Study Indications: Elevated Ddimer.  Risk Factors: COVID 19 positive. Limitations: Poor ultrasound/tissue interface and patient positioning, patient immobility. Comparison Study: No prior studies. Performing Technologist: Chanda BusingGregory Collins RVT  Examination Guidelines: A complete evaluation includes B-mode imaging, spectral Doppler, color Doppler, and power Doppler as needed of all accessible portions of each vessel. Bilateral testing is considered an integral part of a complete examination. Limited examinations for reoccurring  indications may be performed as noted.  +---------+---------------+---------+-----------+----------+--------------+ RIGHT    CompressibilityPhasicitySpontaneityPropertiesThrombus Aging +---------+---------------+---------+-----------+----------+--------------+ CFV      Full           Yes      Yes                                 +---------+---------------+---------+-----------+----------+--------------+ SFJ      Full                                                        +---------+---------------+---------+-----------+----------+--------------+ FV Prox  Full                                                        +---------+---------------+---------+-----------+----------+--------------+ FV Mid   Full                                                        +---------+---------------+---------+-----------+----------+--------------+ FV DistalFull                                                        +---------+---------------+---------+-----------+----------+--------------+ PFV  Full                                                        +---------+---------------+---------+-----------+----------+--------------+ POP      Full           Yes      Yes                                 +---------+---------------+---------+-----------+----------+--------------+ PTV      Full                                                        +---------+---------------+---------+-----------+----------+--------------+ PERO     Full                                                        +---------+---------------+---------+-----------+----------+--------------+   +---------+---------------+---------+-----------+----------+--------------+ LEFT     CompressibilityPhasicitySpontaneityPropertiesThrombus Aging +---------+---------------+---------+-----------+----------+--------------+ CFV      Full           Yes      Yes                                  +---------+---------------+---------+-----------+----------+--------------+ SFJ      Full                                                        +---------+---------------+---------+-----------+----------+--------------+ FV Prox  Full                                                        +---------+---------------+---------+-----------+----------+--------------+ FV Mid   Full                                                        +---------+---------------+---------+-----------+----------+--------------+ FV DistalNone           No       No                   Acute          +---------+---------------+---------+-----------+----------+--------------+ PFV      Full                                                        +---------+---------------+---------+-----------+----------+--------------+  POP      None           No       No                   Acute          +---------+---------------+---------+-----------+----------+--------------+ PTV                                                   Not visualized +---------+---------------+---------+-----------+----------+--------------+ PERO                                                  Not visualized +---------+---------------+---------+-----------+----------+--------------+     Summary: Right: There is no evidence of deep vein thrombosis in the lower extremity. However, portions of this examination were limited- see technologist comments above. No cystic structure found in the popliteal fossa. Left: Findings consistent with acute deep vein thrombosis involving the left femoral vein, and left popliteal vein. No cystic structure found in the popliteal fossa.  *See table(s) above for measurements and observations. Electronically signed by Fabienne Bruns MD on 05/01/2019 at 5:09:51 PM.    Final    ECHOCARDIOGRAM LIMITED  Result Date: 05/01/2019   ECHOCARDIOGRAM LIMITED REPORT   Patient Name:   Renee Kramer Date of Exam:  05/01/2019 Medical Rec #:  [id number]    Height:       66.0 in Accession #:    [id number]   Weight:       148.6 lb Date of Birth:  1928-09-03     BSA:          1.76 m Patient Age:    89 years     BP:           141/62 mmHg Patient Gender: F            HR:           85 bpm. Exam Location:  Inpatient  Procedure: 2D Echo Indications:    stroke 434.91  History:        Patient has no prior history of Echocardiogram examinations. No                 prior cardiac hx on file. covid +.  Sonographer:    Celene Skeen RDCS (AE) Referring Phys: [id number] STEPHEN K CHIU  Sonographer Comments: restricted mobility IMPRESSIONS  1. Left ventricular ejection fraction, by visual estimation, is 50 to 55%. The left ventricle has normal function. There is no increased left ventricular wall thickness.  2. Global right ventricle has normal systolc function.The right ventricular size is normal.  3. The mitral valve is normal in structure. Trivial mitral valve regurgitation. No evidence of mitral stenosis.  4. The tricuspid valve was normal in structure. Tricuspid valve regurgitation is trivial.  5. Tricuspid valve regurgitation is trivial.  6. Mild aortic valve sclerosis without stenosis.  7. The inferior vena cava is normal in size with greater than 50% respiratory variability, suggesting right atrial pressure of 3 mmHg.  8. Normal LV function; mild LVH; mild AI. FINDINGS  Left Ventricle: Left ventricular ejection fraction, by visual estimation, is 50 to 55%. The left ventricle has normal function. There is no increased  left ventricular wall thickness. Normal left atrial pressure. Right Ventricle: The right ventricular size is normal.Global RV systolic function is has normal systolic function. Left Atrium: Left atrial size was normal in size. Right Atrium: Right atrial size was normal in size. Pericardium: There is no evidence of pericardial effusion is seen. There is no evidence of pericardial effusion. Mitral Valve: The mitral valve is normal in  structure. No evidence of mitral valve stenosis by observation. Trivial mitral valve regurgitation. Tricuspid Valve: The tricuspid valve is normal in structure. Tricuspid valve regurgitation is trivial. Aortic Valve: The aortic valve is tricuspid. Aortic valve regurgitation is mild. Mild aortic valve sclerosis is present, with no evidence of aortic valve stenosis. Pulmonic Valve: The pulmonic valve was not well visualized. Pulmonic valve regurgitation is not visualized by color flow Doppler. Pulmonic regurgitation is not visualized by color flow Doppler. Aorta: The aortic root is normal in size and structure. Venous: The inferior vena cava is normal in size with greater than 50% respiratory variability, suggesting right atrial pressure of 3 mmHg.  Additional Comments: Normal LV function; mild LVH; mild AI.  RIGHT ATRIUM           Index RA Area:     11.70 cm RA Volume:   22.30 ml  12.65 ml/m  Olga Millers MD Electronically signed by Olga Millers MD Signature Date/Time: 05/01/2019/2:06:54 PMThe mitral valve is normal in structure.    Final       Subjective: Somnolent.   Discharge Exam: Vitals:   05/02/19 0400 05/02/19 0758  BP: 132/65 139/75  Pulse: 73 71  Resp: 16 19  Temp: (!) 95.5 F (35.3 C) 98.2 F (36.8 C)  SpO2: 96% 96%   Vitals:   05/01/19 2000 05/02/19 0000 05/02/19 0400 05/02/19 0758  BP: (!) 149/53 (!) 138/58 132/65 139/75  Pulse: 67 68 73 71  Resp: 20 16 16 19   Temp: 97.8 F (36.6 C) 97.7 F (36.5 C) (!) 95.5 F (35.3 C) 98.2 F (36.8 C)  TempSrc: Axillary Axillary Rectal Rectal  SpO2: 96% 97% 96% 96%  Weight:      Height:        General: Pt is somonolent Cardiovascular: RRR, S1/S2 +, Respiratory: diminished air entry at bases.  Abdominal: Soft, NT, ND, bowel sounds + Extremities: leg edema present.     The results of significant diagnostics from this hospitalization (including imaging, microbiology, ancillary and laboratory) are listed below for reference.      Microbiology: Recent Results (from the past 240 hour(s))  Blood Cultures (routine x 2)     Status: None (Preliminary result)   Collection Time: 04/29/19  6:19 PM   Specimen: BLOOD  Result Value Ref Range Status   Specimen Description BLOOD LEFT ANTECUBITAL  Final   Special Requests   Final    BOTTLES DRAWN AEROBIC AND ANAEROBIC Blood Culture adequate volume Performed at [facility], [address] [place], [place] [address]    Culture NO GROWTH 3 DAYS  Final   Report Status PENDING  Incomplete  SARS CORONAVIRUS 2 (TAT 6-24 HRS) Nasopharyngeal Nasopharyngeal Swab     Status: Abnormal   Collection Time: 04/30/19  1:23 AM   Specimen: Nasopharyngeal Swab  Result Value Ref Range Status   SARS Coronavirus 2 POSITIVE (A) NEGATIVE Final    Comment: RESULT CALLED TO, READ BACK BY AND VERIFIED WITH: NUSTAFA KOUJAK RN.@[email or web address]  ON 1.31.21 BY TCALDWELL MT. (NOTE) SARS-CoV-2 target nucleic acids are DETECTED. The SARS-CoV-2 RNA is generally detectable  in upper and lower respiratory specimens during the acute phase of infection. Positive results are indicative of the presence of SARS-CoV-2 RNA. Clinical correlation with patient history and other diagnostic information is  necessary to determine patient infection status. Positive results do not rule out bacterial infection or co-infection with other viruses.  The expected result is Negative. Fact Sheet for Patients: HairSlick.no Fact Sheet for Healthcare Providers: quierodirigir.com This test is not yet approved or cleared by the [place] FDA and  has been authorized for detection and/or diagnosis of SARS-CoV-2 by FDA under an Emergency Use Authorization (EUA). This EUA will remain  in effect (meaning this test can  be used) for the duration of the COVID-19 declaration under Section 564(b)(1) of the Act, 21 U.S.C. section 360bbb-3(b)(1), unless the authorization  is terminated or revoked sooner. Performed at [facility], [address] [place], [place] [address]   MRSA PCR Screening     Status: None   Collection Time: 04/30/19  4:04 AM   Specimen: Nasal Mucosa; Nasopharyngeal  Result Value Ref Range Status   MRSA by PCR NEGATIVE NEGATIVE Final    Comment:        The GeneXpert MRSA Assay (FDA approved for NASAL specimens only), is one component of a comprehensive MRSA colonization surveillance program. It is not intended to diagnose MRSA infection nor to guide or monitor treatment for MRSA infections. Performed at [facility], [address], [place], [place] [address]   Blood Cultures (routine x 2)     Status: None (Preliminary result)   Collection Time: 04/30/19  6:14 AM   Specimen: BLOOD  Result Value Ref Range Status   Specimen Description BLOOD LEFT ANTECUBITAL  Final   Special Requests   Final    BOTTLES DRAWN AEROBIC ONLY Blood Culture adequate volume Performed at [facility], [address], [place], [place] [address]    Culture NO GROWTH 2 DAYS  Final   Report Status PENDING  Incomplete  Culture, Urine     Status: Abnormal   Collection Time: 04/30/19  5:19 PM   Specimen: Urine, Random  Result Value Ref Range Status   Specimen Description   Final    URINE, RANDOM Performed at [facility], [address], [place], [place] [address]    Special Requests   Final    NONE Performed at [facility], [address] [place], [place] [address]    Culture (A)  Final    <10,000 COLONIES/mL INSIGNIFICANT GROWTH Performed at [facility], [address] [place], [place] [address]    Report Status 05/01/2019 FINAL  Final     Labs: BNP (last 3 results) No results for input(s): BNP in the last 8760 hours. Basic Metabolic Panel: Recent Labs  Lab 04/29/19 1819 04/30/19 0614 05/01/19 0341 05/02/19 0453 05/02/19 0659  NA 156* 154*  159* 119* 153*  K 3.9 3.6 3.6 2.3* 3.5  CL 119* 124* 128* 97* 130*  CO2 18* 16* 16* 12* 11*  GLUCOSE 169* 188* 166* 1,375* 285*  BUN 81* 81* 95* 70* 93*  CREATININE 4.00* 3.08* 2.64* 1.92* 2.07*  CALCIUM 11.1* 9.7 8.7* 6.5* 8.3*   Liver Function Tests: Recent Labs  Lab 04/29/19 1819 04/30/19 0614 05/01/19 0341 05/02/19 0453 05/02/19 0659  AST 36 28 28 135* 267*  ALT 25 21 20  57* 105*  ALKPHOS 97 78 76 60 86  BILITOT 0.6 0.4 0.4 0.4 0.6  PROT  8.8* 6.9 6.8 4.5* 6.0*  ALBUMIN 3.9 3.0* 2.8* 1.6* 2.2*   No results for input(s): LIPASE, AMYLASE in the last 168 hours. Recent Labs  Lab 04/29/19 1819  AMMONIA 18   CBC: Recent Labs  Lab 04/29/19 1819 04/30/19 0614 05/01/19 0341 05/02/19 0453  WBC 14.1* 10.1 15.0* 10.0  NEUTROABS 12.1* 9.0* 13.8* 9.2*  HGB 14.7 12.5 12.9 9.5*  HCT 45.7 38.3 40.8 33.7*  MCV 94.0 92.7 94.4 106.3*  PLT 134* 60* 68* PLATELET CLUMPS NOTED ON SMEAR, UNABLE TO ESTIMATE   Cardiac Enzymes: No results for input(s): CKTOTAL, CKMB, CKMBINDEX, TROPONINI in the last 168 hours. BNP: Invalid input(s): POCBNP CBG: Recent Labs  Lab 04/30/19 0240  GLUCAP 183*   D-Dimer Recent Labs    05/01/19 0341 05/02/19 0453  DDIMER >20.00* 15.57*   Hgb A1c Recent Labs    05/02/19 0453  HGBA1C 5.7*   Lipid Profile Recent Labs    05/02/19 0453 05/02/19 0659  CHOL 59 75  HDL 18* 25*  LDLCALC 37 45  TRIG 22 26  CHOLHDL 3.3 3.0   Thyroid function studies No results for input(s): TSH, T4TOTAL, T3FREE, THYROIDAB in the last 72 hours.  Invalid input(s): FREET3 Anemia work up No results for input(s): VITAMINB12, FOLATE, FERRITIN, TIBC, IRON, RETICCTPCT in the last 72 hours. Urinalysis    Component Value Date/Time   COLORURINE BROWN (A) 04/29/2019 2350   APPEARANCEUR CLOUDY (A) 04/29/2019 2350   LABSPEC 1.019 04/29/2019 2350   PHURINE 8.0 04/29/2019 2350   GLUCOSEU NEGATIVE 04/29/2019 2350   HGBUR NEGATIVE 04/29/2019 2350   BILIRUBINUR  NEGATIVE 04/29/2019 2350   KETONESUR 5 (A) 04/29/2019 2350   PROTEINUR 100 (A) 04/29/2019 2350   NITRITE NEGATIVE 04/29/2019 2350   LEUKOCYTESUR TRACE (A) 04/29/2019 2350   Sepsis Labs Invalid input(s): PROCALCITONIN,  WBC,  LACTICIDVEN Microbiology Recent Results (from the past 240 hour(s))  Blood Cultures (routine x 2)     Status: None (Preliminary result)   Collection Time: 04/29/19  6:19 PM   Specimen: BLOOD  Result Value Ref Range Status   Specimen Description BLOOD LEFT ANTECUBITAL  Final   Special Requests   Final    BOTTLES DRAWN AEROBIC AND ANAEROBIC Blood Culture adequate volume Performed at [facility], [address] [place], [place] [address]    Culture NO GROWTH 3 DAYS  Final   Report Status PENDING  Incomplete  SARS CORONAVIRUS 2 (TAT 6-24 HRS) Nasopharyngeal Nasopharyngeal Swab     Status: Abnormal   Collection Time: 04/30/19  1:23 AM   Specimen: Nasopharyngeal Swab  Result Value Ref Range Status   SARS Coronavirus 2 POSITIVE (A) NEGATIVE Final    Comment: RESULT CALLED TO, READ BACK BY AND VERIFIED WITH: NUSTAFA KOUJAK RN.@[id number]  ON 1.31.21 BY TCALDWELL MT. (NOTE) SARS-CoV-2 target nucleic acids are DETECTED. The SARS-CoV-2 RNA is generally detectable in upper and lower respiratory specimens during the acute phase of infection. Positive results are indicative of the presence of SARS-CoV-2 RNA. Clinical correlation with patient history and other diagnostic information is  necessary to determine patient infection status. Positive results do not rule out bacterial infection or co-infection with other viruses.  The expected result is Negative. Fact Sheet for Patients: HairSlick.no Fact Sheet for Healthcare Providers: quierodirigir.com This test is not yet approved or cleared by the [place] FDA and  has been authorized for detection and/or diagnosis of SARS-CoV-2 by FDA under an  Emergency Use Authorization (EUA). This EUA will remain  in  effect (meaning this test can  be used) for the duration of the COVID-19 declaration under Section 564(b)(1) of the Act, 21 U.S.C. section 360bbb-3(b)(1), unless the authorization is terminated or revoked sooner. Performed at [facility], [address], [place], [place] [address]   MRSA PCR Screening     Status: None   Collection Time: 04/30/19  4:04 AM   Specimen: Nasal Mucosa; Nasopharyngeal  Result Value Ref Range Status   MRSA by PCR NEGATIVE NEGATIVE Final    Comment:        The GeneXpert MRSA Assay (FDA approved for NASAL specimens only), is one component of a comprehensive MRSA colonization surveillance program. It is not intended to diagnose MRSA infection nor to guide or monitor treatment for MRSA infections. Performed at [facility], [address] [place], [place] [address]   Blood Cultures (routine x 2)     Status: None (Preliminary result)   Collection Time: 04/30/19  6:14 AM   Specimen: BLOOD  Result Value Ref Range Status   Specimen Description BLOOD LEFT ANTECUBITAL  Final   Special Requests   Final    BOTTLES DRAWN AEROBIC ONLY Blood Culture adequate volume Performed at [facility], [address] [place], [place] [address]    Culture NO GROWTH 2 DAYS  Final   Report Status PENDING  Incomplete  Culture, Urine     Status: Abnormal   Collection Time: 04/30/19  5:19 PM   Specimen: Urine, Random  Result Value Ref Range Status   Specimen Description   Final    URINE, RANDOM Performed at [facility], [address] [place], [place] [address]    Special Requests   Final    NONE Performed at [facility], [address], [place], [place] [address]    Culture (A)  Final    <10,000 COLONIES/mL INSIGNIFICANT GROWTH Performed at [facility], [address] [place], [place] [address]    Report Status  05/01/2019 FINAL  Final     Time coordinating discharge: 32 minutes  SIGNED:   Kathlen Mody, MD  [facility] 05/02/2019, 1:20 PM

## 2019-05-02 NOTE — Progress Notes (Signed)
MEWS/VS Documentation      05/02/2019 0056 05/02/2019 0400 05/02/2019 0700 05/02/2019 0758   MEWS Score:  3  3  3  2    MEWS Score Color:  Yellow  Yellow  Yellow  Yellow   Resp:  --  16  --  19   Pulse:  --  73  --  71   BP:  --  132/65  --  139/75   Temp:  --  (!) 95.5 F (35.3 C)  --  98.2 F (36.8 C)   O2 Device:  --  --  --  Nasal Cannula   O2 Flow Rate (L/min):  --  --  --  4 L/min    This is not an acute change in patient's condition. MD aware. Will continue to monitor.

## 2019-05-02 NOTE — Progress Notes (Signed)
STROKE TEAM PROGRESS NOTE   INTERVAL HISTORY Patient is lying in bed.  She is barely responsive.  She has baseline significant dementia.  Given her disabling stroke and poor baseline transition to comfort care and palliative care would be appropriate.  Discussed with Dr. Blake Divine  Vitals:   05/01/19 2000 05/02/19 0000 05/02/19 0400 05/02/19 0758  BP: (!) 149/53 (!) 138/58 132/65 139/75  Pulse: 67 68 73 71  Resp: 20 16 16 19   Temp: 97.8 F (36.6 C) 97.7 F (36.5 C) (!) 95.5 F (35.3 C) 98.2 F (36.8 C)  TempSrc: Axillary Axillary Rectal Rectal  SpO2: 96% 97% 96% 96%  Weight:      Height:        PHYSICAL EXAM Frail elderly Caucasian lady lying in bed. . Afebrile. Head is nontraumatic. Neck is supple without bruit.    Cardiac exam no murmur or gallop. Lungs are clear to auscultation. Distal pulses are well felt. Neurological Exam :  Patient is stuporous and barely responsive.  She opens eyes partially.  She does not follow gaze or commands.  She has left gaze preference.  She does not blink to threat on either side.  In response to sternal rub she is able to move the left upper and lower extremity and does localize.  Right upper extremity is flaccid with no withdrawal.  She has slight withdrawal in the right lower extremity to pain.  Tone is diminished on the right.  Deep tendon reflexes are depressed on the right and preserved on the left.  Right plantar upgoing left downgoing. ASSESSMENT/PLAN [name] is a 86 y.o. female with history of Alzheimer's dementia who was reported positive for COVID-19 by her nursing home approximately 6 days ago. Brought to hospital for generalized weakness and altered mental status in setting of sepsis, UTI and AKI. Found to have LLE DVT, COVID PNA. MRI reveavled bilateral acute infarcts consistent with embolic shower.   Stroke:  B anterior and posterior circulation infarcts in setting of COVID PNA and LLE DVT - infarcts embolic secondary to unknown  source  Code CT head No acute abnormality. Atrophy. Old L frontal lobe infarct.   MRI  Bilateral anterior and posterior circulation infarcts   Carotid Doppler  B ICA 1-39% stenosis, VAs antegrade   2D Echo EF 50-55%. No source of embolus   LDL 45  HgbA1c 5.7  No antithrombotic prior to admission, treated with IV heparin in hospital, now on No antithrombotic given plans for comfort care  Stroke is devastating in setting of complicated medical issues  Disposition:  [facility], comfort care  Other Stroke Risk Factors  Advanced age  Other Active Problems  Alzhemier's dementia   COVID PNA  Severe Sepsis d/t COVID 19  ARF d/t dehydration  FTT d/t dementia, multiple infarcts and multiple medical comorbidities.  LLE DVT  Hospital day # 3  I have personally obtained history,examined this patient, reviewed notes, independently viewed imaging studies, participated in medical decision making and plan of care.ROS completed by me personally and pertinent positives fully documented  I have made any additions or clarifications directly to the above note.  Patient has significant baseline dementia and now presented with bilateral embolic strokes in the setting of DVT and Covid infection.  No prognosis for survival and making any meaningful improvement is negligible.  Given her significant baseline comorbidities transition to comfort care measures only would be appropriate.  Discussed with Dr. 98.  Greater than 50% time during this 25-minute  visit was spent on counseling and coordination of care and discussion with care team.  Antony Contras, MD Medical Director [facility] Pager: [phone] 05/02/2019 4:21 PM   To contact Stroke Continuity provider, please refer to [email or web address] After hours, contact General Neurology

## 2019-05-02 NOTE — Progress Notes (Signed)
ANTICOAGULATION CONSULT NOTE - Follow-Up  Pharmacy Consult for Heparin Indication: VTE treatment, possible PE, new CVA  No Known Allergies  Patient Measurements: Height: 5\' 6"  (167.6 cm) Weight: 148 lb 10.2 oz (67.4 kg) IBW/kg (Calculated) : 59.3 Heparin Dosing Weight: 67.4 kg  Vital Signs: Temp: 95.5 F (35.3 C) (02/02 0400) Temp Source: Rectal (02/02 0400) BP: 132/65 (02/02 0400) Pulse Rate: 73 (02/02 0400)  Labs: Recent Labs    04/29/19 1819 04/29/19 1819 04/30/19 0614 05/01/19 0341 05/01/19 2149 05/02/19 0453  HGB 14.7   < > 12.5 12.9  --   --   HCT 45.7  --  38.3 40.8  --   --   PLT 134*  --  60* 68*  --   --   HEPARINUNFRC  --   --   --   --  0.28* 0.73*  CREATININE 4.00*   < > 3.08* 2.64*  --  1.92*   < > = values in this interval not displayed.    Estimated Creatinine Clearance: 19 mL/min (A) (by C-G formula based on SCr of 1.92 mg/dL (H)).   Medical History: Past Medical History:  Diagnosis Date  . Alzheimer disease ([place])   . Dementia ([place])     Medications:  Medications Prior to Admission  Medication Sig Dispense Refill Last Dose  . acetaminophen (TYLENOL) 500 MG tablet Take 500 mg by mouth every 4 (four) hours as needed for moderate pain, fever or headache.   Unknown  . alum & mag hydroxide-simeth (MINTOX) 025-852-77 MG/5ML suspension Take 30 mLs by mouth every 6 (six) hours as needed for indigestion or heartburn.   Unknown  . bisacodyl (DULCOLAX) 5 MG EC tablet Take 5 mg by mouth 2 (two) times daily.   04/29/2019 at Unknown time  . cholecalciferol (VITAMIN D) 1000 UNITS tablet Take 1,000 Units by mouth daily with breakfast.    04/29/2019 at Unknown time  . donepezil (ARICEPT) 10 MG tablet Take 10 mg by mouth at bedtime.   04/28/2019 at Unknown time  . guaifenesin (ROBAFEN) 100 MG/5ML syrup Take 200 mg by mouth every 6 (six) hours as needed for cough.   Unknown  . levothyroxine (SYNTHROID) 75 MCG tablet Take 75 mcg by mouth daily.   04/29/2019 at Unknown  time  . loperamide (IMODIUM) 2 MG capsule Take 2 mg by mouth as needed for diarrhea or loose stools.   Unknown  . magnesium hydroxide (MILK OF MAGNESIA) 400 MG/5ML suspension Take 30 mLs by mouth at bedtime as needed for mild constipation.   Unknown  . Melatonin 1 MG TABS Take 2 mg by mouth at bedtime.    04/28/2019 at Unknown time  . mirtazapine (REMERON) 30 MG tablet Take 30 mg by mouth at bedtime.   04/28/2019 at Unknown time  . Multiple Vitamin (TAB-A-VITE) TABS Take 1 tablet by mouth daily with breakfast.   04/29/2019 at Unknown time  . Neomycin-Bacitracin-Polymyxin (TRIPLE ANTIBIOTIC) [phone] OINT Apply 1 application topically daily as needed (wounds).    Unknown  . Oyster Shell (OYSTER CALCIUM) 500 MG TABS tablet Take 500 mg of elemental calcium by mouth 2 (two) times daily.   04/29/2019 at Unknown time  . PRESCRIPTION MEDICATION Take 118 mLs by mouth 3 (three) times daily. Mighty shakes   04/29/2019 at Unknown time  . sertraline (ZOLOFT) 25 MG tablet Take 25 mg by mouth daily.   04/29/2019 at Unknown time  . vitamin B-12 (CYANOCOBALAMIN) 1000 MCG tablet Take 1,000 mcg by mouth daily.  04/29/2019 at Unknown time    Assessment:  86 yo F Covid + to start heparin with no bolus for presumed PE, acute LLE DVT and new stroke.  Neuro aware heparin to start for PE/DVT.    2/1: MRI head: multiple areas of acute infarct, pattern suggestive of multiple embol D Dimer > 20 x 3 days.  No bolus per MD orders.   2/2 AM update:  Heparin level elevated  No issues per RN  Goal of Therapy:  Heparin level 0.3 - 0.5 low goal w/ acute stroke Monitor platelets by anticoagulation protocol: Yes   Plan:  Dec heparin to 950 units/hr Re-check heparin level at 1500  Abran Duke, PharmD, BCPS Clinical Pharmacist Phone: [phone]

## 2019-05-02 NOTE — TOC Transition Note (Cosign Needed)
Transition of Care [facility]) - CM/SW Discharge Note   Patient Details  Name: Renee Kramer MRN: [id number] Date of Birth: [birth year]  Transition of Care [facility]) CM/SW Contact:  Terrilee Croak, Student-Social Work Phone Number: 05/02/2019, 2:41 PM   Clinical Narrative Nurse to call report to [phone]    Barriers to Discharge: Other (comment)(Pending hospice bed)   Patient Goals and CMS Choice Patient states their goals for this hospitalization and ongoing recovery are:: Comfort CMS Medicare.gov Compare Post Acute Care list provided to:: Patient Represenative (must comment)(Son, Tammy Sours) Choice offered to / list presented to : Adult Children  Discharge Placement                       Discharge Plan and Services In-house Referral: Clinical Social Work, Hospice / Palliative Care   Post Acute Care Choice: Hospice                               Social Determinants of Health (SDOH) Interventions     Readmission Risk Interventions No flowsheet data found.

## 2019-05-02 NOTE — Progress Notes (Signed)
Patient was discharged to hospice [facility]) by MD order; discharged instructions review and sent to facility via PTAR.  IV in LAC per facility request. Facility was called and report was given to the nurse who is going to receive the patient. Patient will be transported to facility via PTAR. Family aware

## 2019-05-02 NOTE — Progress Notes (Signed)
PT Cancellation Note  Patient Details Name: Renee Kramer MRN: [id number] DOB: [birth year]   Cancelled Treatment:    Reason Eval/Treat Not Completed: (P) Medical issues which prohibited therapy Per Therapy protocol s/p DVT diagnosis pt must be on heprin therapy 24 hours prior to PT session. Pt began heprin 2/1 at 12:00pm. PT will follow back for Evaluation this afternoon.   Vela Render B. Beverely Risen PT, DPT Acute Rehabilitation Services Pager [phone] Office [phone]     [name] 05/02/2019, 8:49 AM

## 2019-05-02 NOTE — Progress Notes (Addendum)
Patient on comfort care, lethargic,unresponsive. Unable to do stroke care plan. Patient will be discharge to [facility]. Will continue to monitor.

## 2019-05-02 NOTE — Progress Notes (Signed)
Carotid duplex has been completed.   Preliminary results in CV Proc.   Blanch Media 05/02/2019 10:39 AM

## 2019-05-04 LAB — CULTURE, BLOOD (ROUTINE X 2)
Culture: NO GROWTH
Special Requests: ADEQUATE

## 2019-05-05 LAB — CULTURE, BLOOD (ROUTINE X 2)
Culture: NO GROWTH
Special Requests: ADEQUATE

## 2019-05-29 DEATH — deceased

## 2020-10-24 IMAGING — CT CT HEAD W/O CM
4 of 5 series · 16 of 47 positions shown, 18 images · non-contrast
Comparison: May 04, 2016

CLINICAL DATA: Altered mental status.

EXAM:
CT HEAD WITHOUT CONTRAST
TECHNIQUE: Contiguous axial images were obtained from the base of the skull
through the vertex without intravenous contrast.

[Series 2: head wo · axial · 0.47mm/px · z∈[+1771,+1891]mm · 8 of 32 slices shown, 10 images (1 of 2)]
[im 4/32  brain]
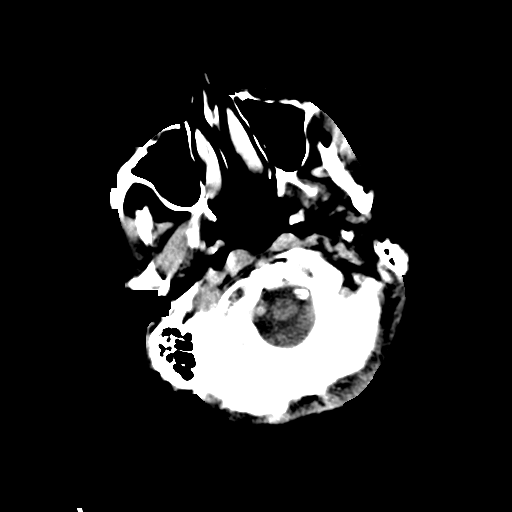
[im 4/32  bone]
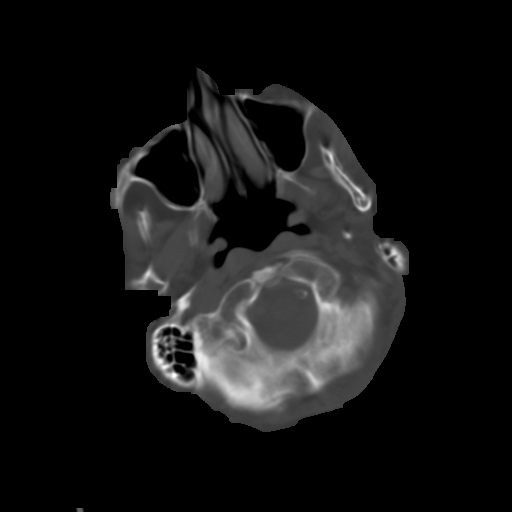
[im 7/32  brain]
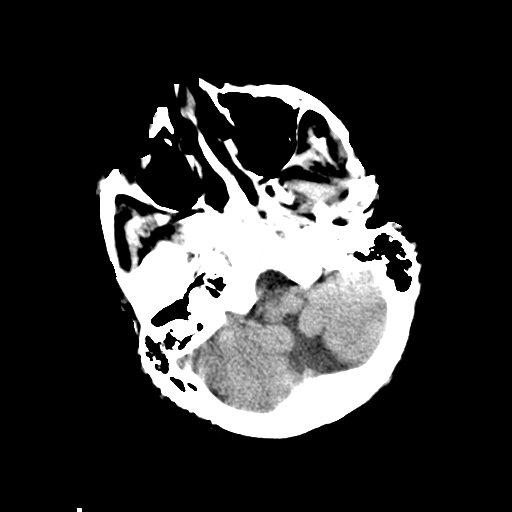
[im 11/32  brain]
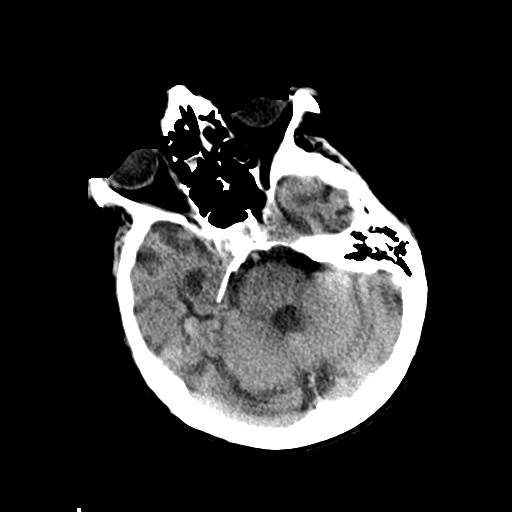
[im 14/32  brain]
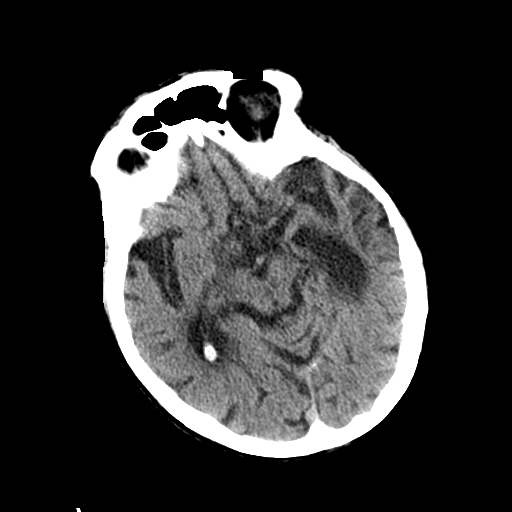
[im 18/32  brain]
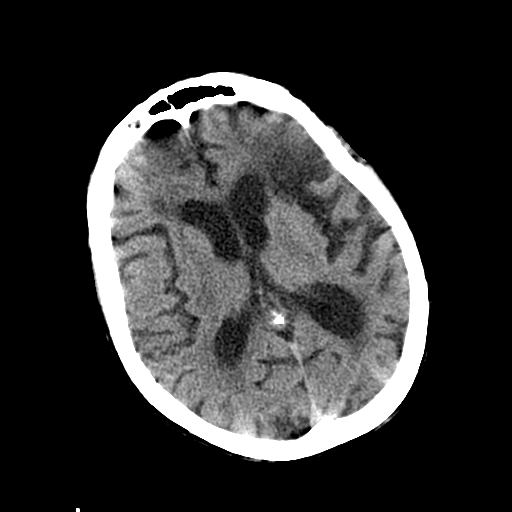
[im 18/32  bone]
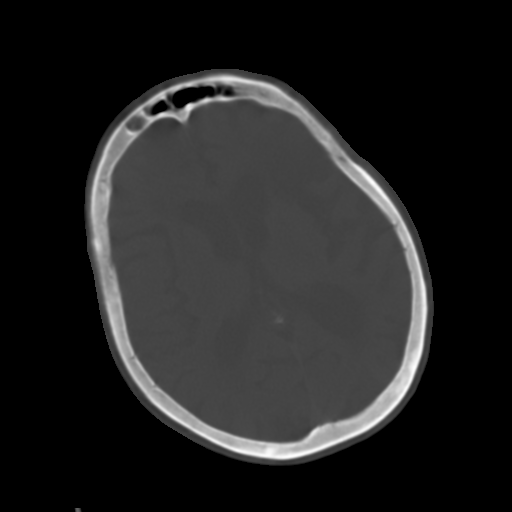
[im 21/32  brain]
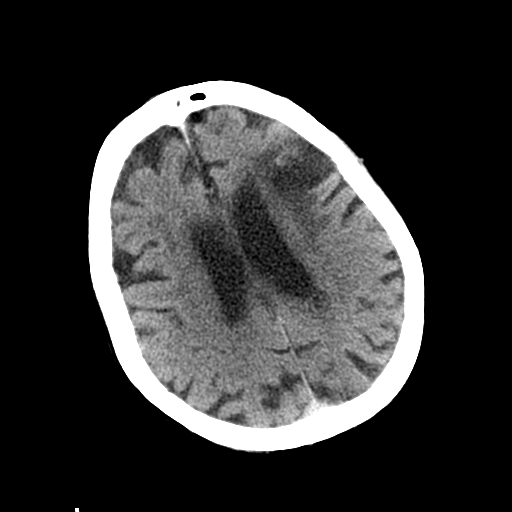
[im 25/32  brain]
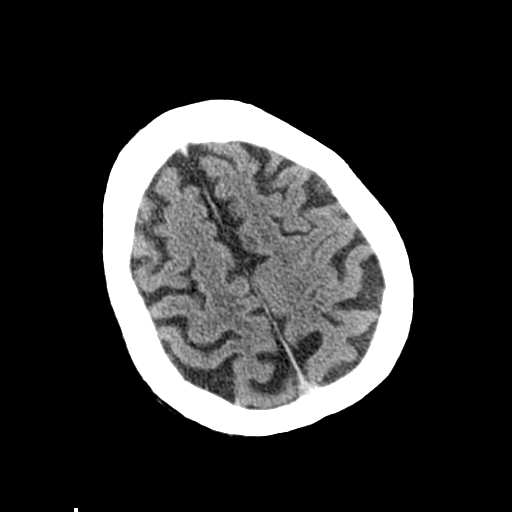
[im 28/32  brain]
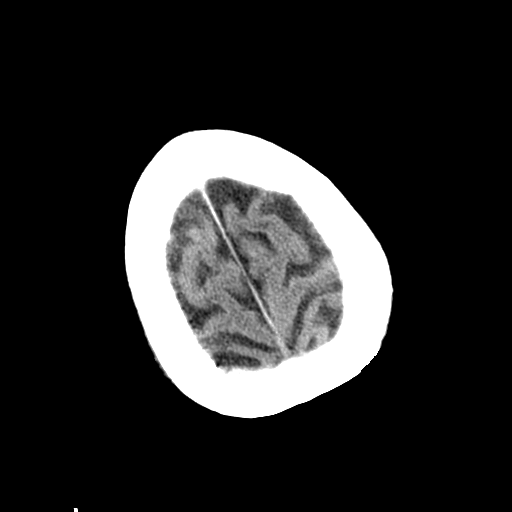

[Series 4: coronal soft tissue · coronal · 0.34mm/px · 3 of 63 slices shown]
[im 21/63  brain]
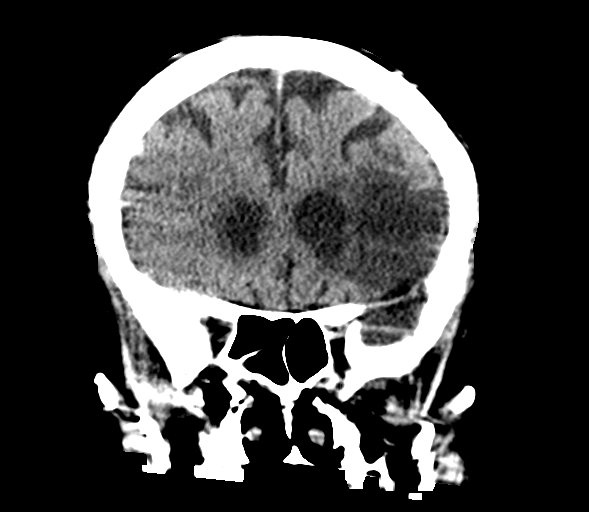
[im 28/63  brain]
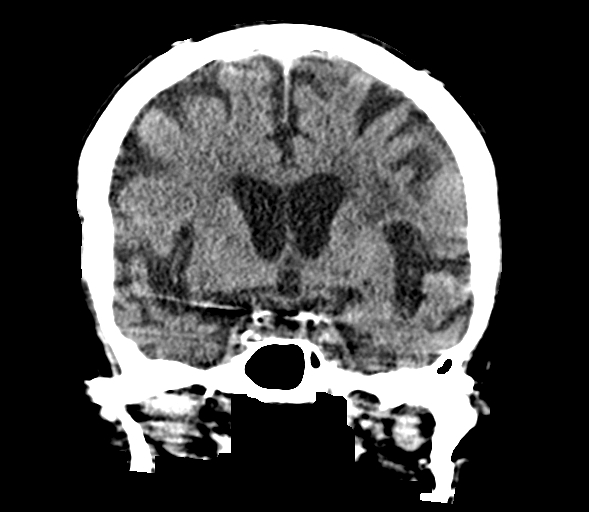
[im 35/63  brain]
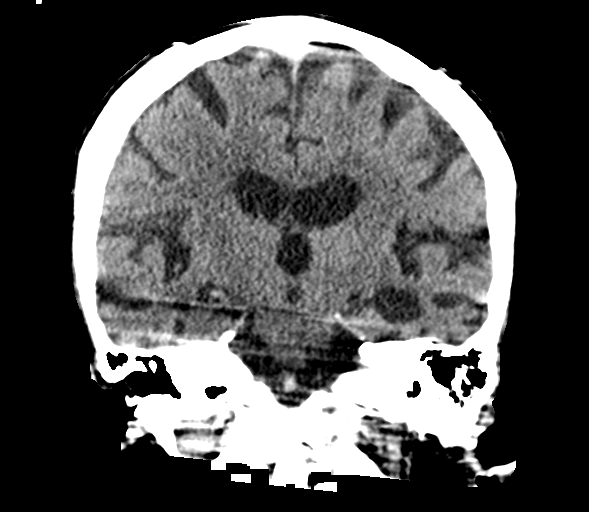

[Series 5: sagittal soft tissue · sagittal · 0.35mm/px · 3 of 51 slices shown]
[im 17/51  brain]
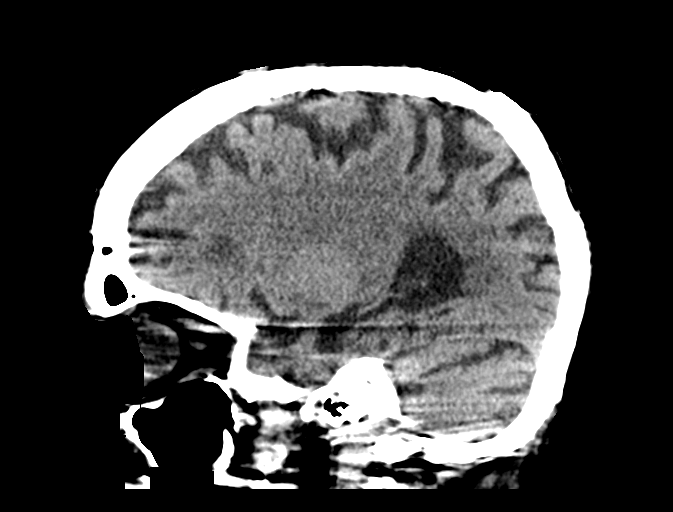
[im 26/51  brain]
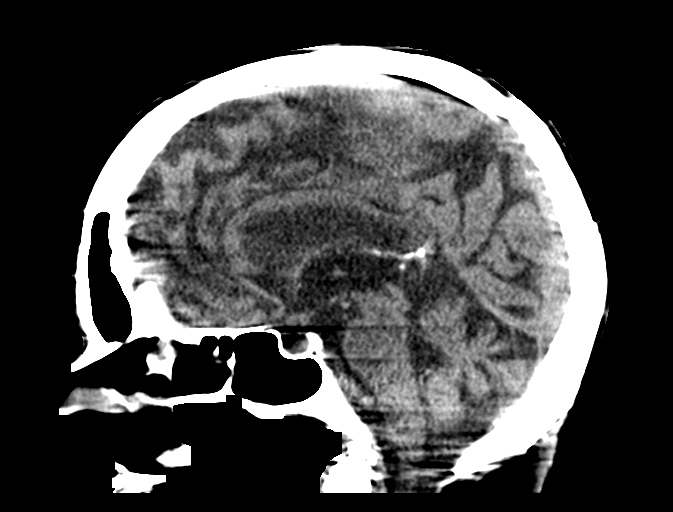
[im 34/51  brain]
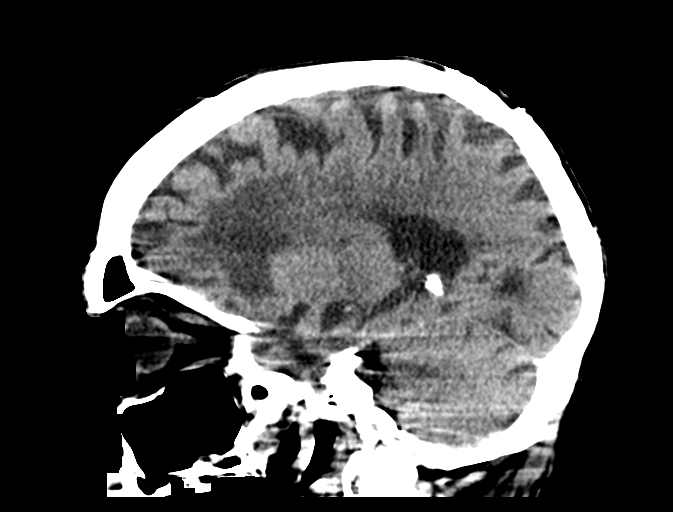

[Series 7: head wo · axial · 0.47mm/px · z∈[+1771,+1786]mm · 2 of 31 slices shown (2 of 2)]
[im 4/31  brain]
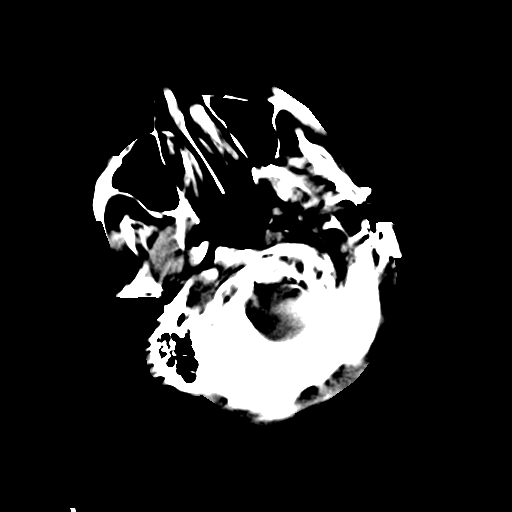
[im 7/31  brain]
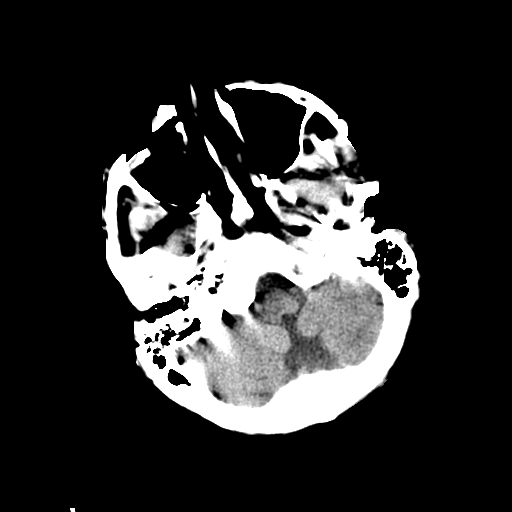

[16 of 47 positions shown; findings below may reference images not displayed]

FINDINGS: Brain: There is moderate severity cerebral atrophy with widening of
the extra-axial spaces and ventricular dilatation.
There are areas of decreased attenuation within the white matter
tracts of the supratentorial brain, consistent with microvascular
disease changes.

A small to moderate sized area of cortical encephalomalacia, with
adjacent chronic white matter low attenuation, is seen within the
left frontal lobe. This represents a new finding when compared to
the prior study.

Vascular: No hyperdense vessel or unexpected calcification.

Skull: Normal. Negative for fracture or focal lesion.

Sinuses/Orbits: There is very mild bilateral ethmoid sinus mucosal
thickening.

Other: None.
IMPRESSION: 1. Generalized cerebral atrophy.
2. No acute intracranial abnormality.
3. Chronic left frontal lobe infarct.
